# Patient Record
Sex: Male | Born: 1960 | Race: Black or African American | Hispanic: No | Marital: Single | Smoking: Current every day smoker
Health system: Southern US, Community
[De-identification: ages and names within clinical notes are randomized; demographics above are authoritative.]

## PROBLEM LIST (undated history)

## (undated) DIAGNOSIS — E119 Type 2 diabetes mellitus without complications: Secondary | ICD-10-CM

## (undated) DIAGNOSIS — E785 Hyperlipidemia, unspecified: Secondary | ICD-10-CM

## (undated) DIAGNOSIS — I10 Essential (primary) hypertension: Secondary | ICD-10-CM

## (undated) DIAGNOSIS — F1910 Other psychoactive substance abuse, uncomplicated: Secondary | ICD-10-CM

## (undated) DIAGNOSIS — M109 Gout, unspecified: Secondary | ICD-10-CM

## (undated) DIAGNOSIS — K219 Gastro-esophageal reflux disease without esophagitis: Secondary | ICD-10-CM

## (undated) HISTORY — PX: CORONARY ARTERY BYPASS GRAFT: SHX141

---

## 2015-03-18 HISTORY — PX: CORONARY ANGIOPLASTY WITH STENT PLACEMENT: SHX49

## 2016-02-08 ENCOUNTER — Encounter (HOSPITAL_COMMUNITY): Payer: Self-pay | Admitting: Emergency Medicine

## 2016-02-08 ENCOUNTER — Emergency Department (HOSPITAL_COMMUNITY)
Admission: EM | Admit: 2016-02-08 | Discharge: 2016-02-08 | Disposition: A | Discharge status: Home / Self Care | Payer: Medicare Other | Attending: Emergency Medicine | Admitting: Emergency Medicine

## 2016-02-08 DIAGNOSIS — Z87891 Personal history of nicotine dependence: Secondary | ICD-10-CM | POA: Insufficient documentation

## 2016-02-08 DIAGNOSIS — M79671 Pain in right foot: Secondary | ICD-10-CM

## 2016-02-08 DIAGNOSIS — M79672 Pain in left foot: Secondary | ICD-10-CM | POA: Insufficient documentation

## 2016-02-08 HISTORY — DX: Gout, unspecified: M10.9

## 2016-02-08 HISTORY — DX: Other psychoactive substance abuse, uncomplicated: F19.10

## 2016-02-08 LAB — URINALYSIS, ROUTINE W REFLEX MICROSCOPIC
BILIRUBIN URINE: NEGATIVE
GLUCOSE, UA: NEGATIVE mg/dL
Hgb urine dipstick: NEGATIVE
Ketones, ur: NEGATIVE mg/dL
Leukocytes, UA: NEGATIVE
NITRITE: NEGATIVE
PH: 5 (ref 5.0–8.0)
Protein, ur: NEGATIVE mg/dL
SPECIFIC GRAVITY, URINE: 1.029 (ref 1.005–1.030)

## 2016-02-08 LAB — BASIC METABOLIC PANEL
ANION GAP: 8 (ref 5–15)
BUN: 13 mg/dL (ref 6–20)
CHLORIDE: 108 mmol/L (ref 101–111)
CO2: 25 mmol/L (ref 22–32)
Calcium: 9.3 mg/dL (ref 8.9–10.3)
Creatinine, Ser: 0.99 mg/dL (ref 0.61–1.24)
GFR calc Af Amer: >60 mL/min (ref >60)
GLUCOSE: 90 mg/dL (ref 65–99)
POTASSIUM: 4.2 mmol/L (ref 3.5–5.1)
Sodium: 141 mmol/L (ref 135–145)

## 2016-02-08 LAB — CBC
HEMATOCRIT: 41.2 % (ref 39.0–52.0)
HEMOGLOBIN: 13.8 g/dL (ref 13.0–17.0)
MCH: 29.5 pg (ref 26.0–34.0)
MCHC: 33.5 g/dL (ref 30.0–36.0)
MCV: 88 fL (ref 78.0–100.0)
Platelets: 267 10*3/uL (ref 150–400)
RBC: 4.68 MIL/uL (ref 4.22–5.81)
RDW: 13.7 % (ref 11.5–15.5)
WBC: 6.9 10*3/uL (ref 4.0–10.5)

## 2016-02-08 MED ORDER — KETOROLAC TROMETHAMINE 15 MG/ML IJ SOLN: 15.0000 mg | Freq: Once | INTRAMUSCULAR | Status: DC

## 2016-02-08 MED ORDER — KETOROLAC TROMETHAMINE 15 MG/ML IJ SOLN
15.0000 mg | Freq: Once | INTRAMUSCULAR | Status: AC
  Administered 2016-02-08: 15 mg via INTRAMUSCULAR
  Filled 2016-02-08: qty 1

## 2016-02-08 NOTE — ED Provider Notes (Signed)
[facility] DEPT Provider Note   CSN: [id number] Arrival date & time: 02/08/16  1324  History   Chief Complaint Chief Complaint  Patient presents with  . Near Syncope  . Congestive Heart Failure  . Altered Mental Status   HPI Victor Ross is a 55 y.o. male.  HPI Presenting with pain which occurred in bilateral feet after standing all morning. Had been standing all morning for [employer] when his occurred. 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 Would like a note stating he cannot do this due to his cardiac history and foot pain. Reports history of gout.  Also of note, reports significant cardiac history, stating he had three stents placed in his heart in January 2017. He was supposed to follow up with Cardiology in August 2017, but he missed this appointment and then moved from [place] to [place]. Reports he also has history of his heart racing and was given medicine to take as needed to help it slow down, but does not remember the name of his diagnosis. Does not smoke. Reports history of hyperlipidemia, hypertension, and diabetes.   Past Medical History:  Diagnosis Date  . Drug abuse   . Gout    There are no active problems to display for this patient.  History reviewed. No pertinent surgical history.   Home Medications    Prior to Admission medications   Not on File    Family History No family history on file.  Social History Social History  Substance Use Topics  . Smoking status: Former Research scientist (life sciences)  . Smokeless tobacco: Former Systems developer  . Alcohol use Yes     Allergies   Patient has no known allergies.   Review of Systems Review of Systems  Constitutional: Negative for diaphoresis and fever.  Respiratory: Negative for shortness of breath.   Cardiovascular: Negative for chest pain, palpitations and leg swelling.  Neurological: Positive for dizziness.     Physical Exam Updated Vital  Signs BP 142/75   Pulse (!) 57   Temp 97.9 F (36.6 C) (Oral)   Resp 15   Ht 5\' 9"  (1.753 m)   Wt 115.2 kg   SpO2 100%   BMI 37.51 kg/m   Physical Exam  Constitutional: He appears well-developed and well-nourished. No distress.  Cardiovascular: Normal rate and regular rhythm.   No murmur heard. Pulmonary/Chest: Effort normal. No respiratory distress. He has no wheezes.  Abdominal: Soft. He exhibits no distension. There is no tenderness.  Musculoskeletal: He exhibits no edema.  No tenderness over feet bilaterally  Skin: Skin is warm. No erythema.  Psychiatric: He has a normal mood and affect. His behavior is normal.     ED Treatments / Results  Labs (all labs ordered are listed, but only abnormal results are displayed) Labs Reviewed  BASIC METABOLIC PANEL  CBC  URINALYSIS, ROUTINE W REFLEX MICROSCOPIC (NOT AT [facility])    EKG  EKG Interpretation  Date/Time:  Friday February 08 2016 13:43:48 EST Ventricular Rate:  63 PR Interval:  148 QRS Duration: 146 QT Interval:  432 QTC Calculation: 442 R Axis:   -151 Text Interpretation:  Normal sinus rhythm Right bundle branch block Abnormal ECG No previous tracing Confirmed by KNOTT MD, DANIEL [id number]) on 02/08/2016 4:49:47 PM      Radiology No results found.  Procedures Procedures (including critical care time)  Medications Ordered in ED Medications  ketorolac (TORADOL) 15 MG/ML injection 15 mg (15  mg Intramuscular Given 02/08/16 1710)    Initial Impression / Assessment and Plan / ED Course  I have reviewed the triage vital signs and the nursing notes.  Pertinent labs & imaging results that were available during my care of the patient were reviewed by me and considered in my medical decision making (see chart for details).  Clinical Course   - BMP normal - CBC normal - EKG with right bundle branch block, normal rate and sinus rhythm  Final Clinical Impressions(s) / ED Diagnoses   Final diagnoses:  Pain in both  feet  - Nontender with normal exam. Doubt gout as contributing. Note given to provide breaks from standing every 4hours. Over the counter treatment as needed. Otherwise, follow up with PCP. Recommend Cardiology referral as outpatient.  New Prescriptions New Prescriptions   No medications on file     Lorna Few, DO 02/08/16 1732    Leo Grosser, MD 02/09/16 [phone]

## 2016-02-08 NOTE — ED Triage Notes (Signed)
Pt. Stated, I was working at the [employer] , Im in the program and all Of a sudden I couldn't remember what I was suppose to do. Pt. At triage, alert and orient x 4. Pt. Stated, I think its bothering me to stand on my feet for 10 hours.

## 2016-02-08 NOTE — Discharge Instructions (Signed)
Please establish with primary physician for follow up. You will need a referral to Cardiology. You may take over the counter pain medication for your pain.

## 2016-02-18 ENCOUNTER — Ambulatory Visit (INDEPENDENT_AMBULATORY_CARE_PROVIDER_SITE_OTHER): Payer: Medicare Other | Admitting: Family Medicine

## 2016-02-18 ENCOUNTER — Encounter: Payer: Self-pay | Admitting: Family Medicine

## 2016-02-18 ENCOUNTER — Telehealth: Payer: Self-pay

## 2016-02-18 VITALS — BP 116/73 | HR 53 | Temp 97.5°F | Resp 14 | Ht 69.0 in | Wt 260.0 lb

## 2016-02-18 DIAGNOSIS — E119 Type 2 diabetes mellitus without complications: Secondary | ICD-10-CM | POA: Insufficient documentation

## 2016-02-18 DIAGNOSIS — E118 Type 2 diabetes mellitus with unspecified complications: Secondary | ICD-10-CM

## 2016-02-18 DIAGNOSIS — Z1159 Encounter for screening for other viral diseases: Secondary | ICD-10-CM | POA: Diagnosis not present

## 2016-02-18 DIAGNOSIS — Z7689 Persons encountering health services in other specified circumstances: Secondary | ICD-10-CM

## 2016-02-18 DIAGNOSIS — I25810 Atherosclerosis of coronary artery bypass graft(s) without angina pectoris: Secondary | ICD-10-CM | POA: Insufficient documentation

## 2016-02-18 DIAGNOSIS — I209 Angina pectoris, unspecified: Secondary | ICD-10-CM | POA: Diagnosis not present

## 2016-02-18 DIAGNOSIS — Z114 Encounter for screening for human immunodeficiency virus [HIV]: Secondary | ICD-10-CM | POA: Diagnosis not present

## 2016-02-18 DIAGNOSIS — I25118 Atherosclerotic heart disease of native coronary artery with other forms of angina pectoris: Secondary | ICD-10-CM

## 2016-02-18 DIAGNOSIS — Z125 Encounter for screening for malignant neoplasm of prostate: Secondary | ICD-10-CM

## 2016-02-18 DIAGNOSIS — I25708 Atherosclerosis of coronary artery bypass graft(s), unspecified, with other forms of angina pectoris: Secondary | ICD-10-CM | POA: Diagnosis not present

## 2016-02-18 LAB — LIPID PANEL
CHOL/HDL RATIO: 3.5 ratio (ref <5.0)
Cholesterol: 99 mg/dL (ref <200)
HDL: 28 mg/dL — ABNORMAL LOW (ref >40)
LDL CALC: 51 mg/dL (ref <100)
Triglycerides: 98 mg/dL (ref <150)
VLDL: 20 mg/dL (ref <30)

## 2016-02-18 LAB — POCT GLYCOSYLATED HEMOGLOBIN (HGB A1C): HEMOGLOBIN A1C: 6.2

## 2016-02-18 MED ORDER — NITROGLYCERIN 0.4 MG SL SUBL: 0.4000 mg | SUBLINGUAL_TABLET | SUBLINGUAL | 2 refills | Status: AC | PRN

## 2016-02-18 MED ORDER — METFORMIN HCL 500 MG PO TABS: 500.0000 mg | ORAL_TABLET | Freq: Two times a day (BID) | ORAL | 1 refills | Status: AC

## 2016-02-18 NOTE — Progress Notes (Signed)
Victor Ross, is a 55 y.o. male  [id number]  [id number]  DOB - [birth year]  CC:  Chief Complaint  Patient presents with  . new patient    here to get established in a program until June 2018 when he will return to [place], he does not have pharmacy here, will call with name of one, he needs refill on Metformin and Nitrogylcerin and needs referral to cardiologist, declines flu vaccine       HPI: Victor Ross is a 55 y.o. male here to establish care. He is from [place] but is in [place] until June 2018 in a drug rehab program. He has a history of diabetes and coronary artery disease with a triple BPG in 2012 and stent placement in 2017. He needs referral to cardiologist to follow him while he is here. He needs refills on his metfor 500 and on NTG. His other medications are listed in his medication list and have been reviewed. He voices no complaints today. He reports he uses NTG about once a month. He has in the last few weeks stopped smoking, drinking alcohol and using cocaine. He also has a history of gout.   Health Maintenance: Declines immunizations today. Reports having colonoscopy 2 years ago.    No Known Allergies Past Medical History:  Diagnosis Date  . Drug abuse   . Gout    No current outpatient prescriptions on file prior to visit.   No current facility-administered medications on file prior to visit.    Family History  Problem Relation Age of Onset  . Cancer Father    Social History   Social History  . Marital status: Single    Spouse name: Victor Ross  . Number of children: Victor Ross  . Years of education: Victor Ross   Occupational History  . Not on file.   Social History Main Topics  . Smoking status: Former Research scientist (life sciences)  . Smokeless tobacco: Former Systems developer  . Alcohol use Yes     Comment: currently not using  . Drug use:     Types: Cocaine     Comment: currently not using  . Sexual activity: Not on file   Other Topics Concern  . Not on file   Social History  Narrative  . No narrative on file    Review of Systems: Constitutional: + for fatigue Skin: Negative HENT: Negative  Eyes: Negative. Need glasses Neck: Negative Respiratory: Negative Cardiovascular: + for occ chest pressure, relieved with NTG. + occ palpitations Gastrointestinal: Negative Genitourinary: Negative  Musculoskeletal: + for back, knee and ankle pain (chronic)  Neurological: Negative for Hematological: Negative  Psychiatric/Behavioral: Negative    Objective:   Vitals:   02/18/16 0832  BP: 116/73  Pulse: (!) 53  Resp: 14  Temp: 97.5 F (36.4 C)    Physical Exam: Constitutional: Patient appears well-developed and well-nourished. No distress. HENT: Normocephalic, atraumatic, External right and left ear normal. Oropharynx is clear and moist.  Eyes: Conjunctivae and EOM are normal. PERRLA, no scleral icterus. Neck: Normal ROM. Neck supple. No lymphadenopathy, No thyromegaly. CVS: RRR, S1/S2 +, no murmurs, no gallops, no rubs Pulmonary: Effort and breath sounds normal, no stridor, rhonchi, wheezes, rales.  Abdominal: Soft. Normoactive BS,, no distension, tenderness, rebound or guarding.  Musculoskeletal: Normal range of motion. No edema and no tenderness.  Neuro: Alert.Normal muscle tone coordination. Non-focal Skin: Skin is warm and dry. No rash noted. Not diaphoretic. No erythema. No pallor. Psychiatric: Normal mood and affect. Behavior, judgment, thought content normal.  Lab Results  Component Value Date   WBC 6.9 02/08/2016   HGB 13.8 02/08/2016   HCT 41.2 02/08/2016   MCV 88.0 02/08/2016   PLT 267 02/08/2016   Lab Results  Component Value Date   CREATININE 0.99 02/08/2016   BUN 13 02/08/2016   NA 141 02/08/2016   K 4.2 02/08/2016   CL 108 02/08/2016   CO2 25 02/08/2016    Lab Results  Component Value Date   HGBA1C 6.2 02/18/2016   Lipid Panel  No results found for: CHOL, TRIG, HDL, CHOLHDL, VLDL, LDLCALC      Assessment and plan:   1.  Type 2 diabetes mellitus with complication, without long-term current use of insulin (HCC)  - POCT glycosylated hemoglobin (Hb A1C) - Lipid panel  2. Encounter for special screening examination for HIV  - HIV antibody (with reflex)  3. Encounter for hepatitis C screening test for low risk patient  - Hepatitis C Antibody  5. Coronary artery disease of native heart with stable angina pectoris, unspecified vessel or lesion type [facility])  - Ambulatory referral to Cardiology   Return in about 3 months (around 05/18/2016).  The patient was given clear instructions to go to ER or return to medical center if symptoms don't improve, worsen or new problems develop. The patient verbalized understanding.    Micheline Chapman FNP  02/18/2016, 10:39 AM

## 2016-02-18 NOTE — Patient Instructions (Signed)
Continue current treatment Follow-up in 3 months. Will put in referral for cardiologist.

## 2016-02-19 LAB — HEPATITIS C ANTIBODY: HCV Ab: NEGATIVE

## 2016-02-19 LAB — HIV ANTIBODY (ROUTINE TESTING W REFLEX): HIV 1&2 Ab, 4th Generation: NONREACTIVE

## 2016-04-15 ENCOUNTER — Ambulatory Visit: Payer: Medicare Other | Admitting: Interventional Cardiology

## 2016-04-17 ENCOUNTER — Encounter: Payer: Self-pay | Admitting: Interventional Cardiology

## 2016-05-20 ENCOUNTER — Ambulatory Visit: Payer: Medicare Other | Admitting: Family Medicine

## 2017-04-19 ENCOUNTER — Other Ambulatory Visit: Payer: Self-pay

## 2017-04-19 ENCOUNTER — Observation Stay
Admission: EM | Admit: 2017-04-19 | Discharge: 2017-04-19 | Discharge status: Against Medical Advice | Payer: Medicare Other | Attending: Internal Medicine | Admitting: Internal Medicine

## 2017-04-19 DIAGNOSIS — Z7982 Long term (current) use of aspirin: Secondary | ICD-10-CM | POA: Diagnosis not present

## 2017-04-19 DIAGNOSIS — Z5321 Procedure and treatment not carried out due to patient leaving prior to being seen by health care provider: Secondary | ICD-10-CM | POA: Insufficient documentation

## 2017-04-19 DIAGNOSIS — I25810 Atherosclerosis of coronary artery bypass graft(s) without angina pectoris: Secondary | ICD-10-CM | POA: Insufficient documentation

## 2017-04-19 DIAGNOSIS — E119 Type 2 diabetes mellitus without complications: Secondary | ICD-10-CM | POA: Diagnosis not present

## 2017-04-19 DIAGNOSIS — F17200 Nicotine dependence, unspecified, uncomplicated: Secondary | ICD-10-CM | POA: Diagnosis not present

## 2017-04-19 DIAGNOSIS — M109 Gout, unspecified: Secondary | ICD-10-CM | POA: Insufficient documentation

## 2017-04-19 DIAGNOSIS — Z955 Presence of coronary angioplasty implant and graft: Secondary | ICD-10-CM | POA: Diagnosis not present

## 2017-04-19 DIAGNOSIS — Z951 Presence of aortocoronary bypass graft: Secondary | ICD-10-CM | POA: Diagnosis not present

## 2017-04-19 DIAGNOSIS — Z79899 Other long term (current) drug therapy: Secondary | ICD-10-CM | POA: Insufficient documentation

## 2017-04-19 DIAGNOSIS — K922 Gastrointestinal hemorrhage, unspecified: Secondary | ICD-10-CM | POA: Diagnosis present

## 2017-04-19 DIAGNOSIS — Z7902 Long term (current) use of antithrombotics/antiplatelets: Secondary | ICD-10-CM | POA: Insufficient documentation

## 2017-04-19 DIAGNOSIS — K625 Hemorrhage of anus and rectum: Secondary | ICD-10-CM | POA: Diagnosis present

## 2017-04-19 LAB — COMPREHENSIVE METABOLIC PANEL
ALK PHOS: 108 U/L (ref 38–126)
ALT: 40 U/L (ref 17–63)
AST: 31 U/L (ref 15–41)
Albumin: 4.4 g/dL (ref 3.5–5.0)
Anion gap: 10 (ref 5–15)
BILIRUBIN TOTAL: 1.1 mg/dL (ref 0.3–1.2)
BUN: 14 mg/dL (ref 6–20)
CALCIUM: 9.2 mg/dL (ref 8.9–10.3)
CO2: 21 mmol/L — ABNORMAL LOW (ref 22–32)
CREATININE: 0.94 mg/dL (ref 0.61–1.24)
Chloride: 106 mmol/L (ref 101–111)
Glucose, Bld: 159 mg/dL — ABNORMAL HIGH (ref 65–99)
Potassium: 3.9 mmol/L (ref 3.5–5.1)
Sodium: 137 mmol/L (ref 135–145)
Total Protein: 7.2 g/dL (ref 6.5–8.1)

## 2017-04-19 LAB — TYPE AND SCREEN
ABO/RH(D): A POS
ANTIBODY SCREEN: NEGATIVE

## 2017-04-19 LAB — CBC
HCT: 44.7 % (ref 40.0–52.0)
Hemoglobin: 15.1 g/dL (ref 13.0–18.0)
MCH: 29.7 pg (ref 26.0–34.0)
MCHC: 33.9 g/dL (ref 32.0–36.0)
MCV: 87.6 fL (ref 80.0–100.0)
Platelets: 273 10*3/uL (ref 150–440)
RBC: 5.1 MIL/uL (ref 4.40–5.90)
RDW: 14 % (ref 11.5–14.5)
WBC: 9.3 10*3/uL (ref 3.8–10.6)

## 2017-04-19 NOTE — ED Triage Notes (Signed)
Pt arrives to ED c/o of "bloody stool that is dark red" x 2 days. States has happened before. Denies hemorrhoids. Takes plavix. Alert, oriented, ambulatory. No distress noted.

## 2017-04-19 NOTE — ED Notes (Signed)
Admitting medical provider spoke to Charge nurse and informed charge that patient wants to leave.

## 2017-04-19 NOTE — ED Notes (Signed)
Patient refused vital signs 

## 2017-04-19 NOTE — Consult Note (Signed)
Medical Consultation  Victor Ross [id number] DOB: 1960/11/21 DOA: 04/19/2017 PCP: Micheline Chapman, NP   Requesting physician: dr Dineen Kid Date of consultation: 04/19/2017 Reason for consultation: rectal bleed  Impression/Recommendations  57 year old male with history of CAD, tobacco dependence and drug abuse who presents with rectal bleed  1. Rectal bleed: Patient was encouraged to stay in the hospital however he was very adamant about being discharged. I did discuss that there is possibility he could have further rectal bleed and become more symptomatic. I also discussed with the patient since he is on blood thinner for his CAD/stent it is very important that he have GI follow-up due to his rectal bleeding. He voiced that he understood this however he wanted to go home  2. Tobacco dependence: Patient is encouraged to quit smoking. Counseling was provided for 4 minutes.    Chief Complaint: rectal bleed  HPI:   57 year old male with history of CAD and tobacco dependence who presented to the ER complaining of rectal bleed. Patient proceeded to large bloody bowel movements this morning. He denies abdominal pain, nausea and vomiting. He denies dark color stools. He is on blood thinners,  Review of Systems  Constitutional: Negative for fever, chills weight loss HENT: Negative for ear pain, nosebleeds, congestion, facial swelling, rhinorrhea, neck pain, neck stiffness and ear discharge.   Respiratory: Negative for cough, shortness of breath, wheezing  Cardiovascular: Negative for chest pain, palpitations and leg swelling.  Gastrointestinal: Negative for heartburn, abdominal pain, vomiting, diarrhea or consitpation Positive for rectal bleed Genitourinary: Negative for dysuria, urgency, frequency, hematuria Musculoskeletal: Negative for back pain or joint pain Neurological: Negative for dizziness, seizures, syncope, focal weakness,  numbness and headaches.  Hematological: Does not  bruise/bleed easily.  Psychiatric/Behavioral: Negative for hallucinations, confusion, dysphoric mood   Past Medical History:  Diagnosis Date  . Drug abuse ([place])   . Gout    Past Surgical History:  Procedure Laterality Date  . CORONARY ANGIOPLASTY WITH STENT PLACEMENT Left 03/2015  . CORONARY ARTERY BYPASS GRAFT     Social History:  reports that he has been smoking.  He has quit using smokeless tobacco. He reports that he drinks alcohol. He reports that he uses drugs. Drug: Cocaine.  No Known Allergies Family History  Problem Relation Age of Onset  . Cancer Father     Prior to Admission medications   Medication Sig Start Date End Date Taking? Authorizing Provider  aspirin EC 81 MG tablet Take 81 mg by mouth daily.   Yes [provider]  carvedilol (COREG) 12.5 MG tablet Take 12.5 mg by mouth 2 (two) times daily with a meal.   Yes [provider]  COLCRYS 0.6 MG tablet Take 1 tablet by mouth as needed. For acute gout - repeat dose after 1 to 2 hours if needed 02/13/17  Yes [provider]  lisinopril (PRINIVIL,ZESTRIL) 10 MG tablet Take 10 mg by mouth daily. 04/10/17  Yes [provider]  metFORMIN (GLUCOPHAGE) 1000 MG tablet Take 1,000 mg by mouth 2 (two) times daily. 04/10/17  Yes [provider]  nitroGLYCERIN (NITROSTAT) 0.4 MG SL tablet Place 1 tablet (0.4 mg total) under the tongue every 5 (five) minutes as needed for chest pain. 02/18/16  Yes Micheline Chapman, NP  pantoprazole (PROTONIX) 40 MG tablet Take 40 mg by mouth daily. 03/28/17  Yes [provider]  atorvastatin (LIPITOR) 80 MG tablet Take 80 mg by mouth daily.    [provider]  metFORMIN (GLUCOPHAGE) 500 MG  tablet Take 1 tablet (500 mg total) by mouth 2 (two) times daily with a meal. Patient not taking: Reported on 04/19/2017 02/18/16   Micheline Chapman, NP  ticagrelor (BRILINTA) 90 MG TABS tablet Take by mouth 2 (two) times daily.    [provider]    Physical Exam: Blood pressure 127/76, pulse 89, temperature 98.3 F (36.8 C), temperature source Oral, resp. rate 16, height 5\' 9"  (1.753 m), weight 122.5 kg (270 lb), SpO2 95 %. @VITALS2 @ Filed Weights   04/19/17 1510  Weight: 122.5 kg (270 lb)   No intake or output data in the 24 hours ending 04/19/17 1634   Constitutional: Appears well-developed and well-nourished. No distress. HENT: Normocephalic. Marland Kitchen Oropharynx is clear and moist.  Eyes: Conjunctivae and EOM are normal. PERRLA, no scleral icterus.  Neck: Normal ROM. Neck supple. No JVD. No tracheal deviation. CVS: RRR, S1/S2 +, no murmurs, no gallops, no carotid bruit.  Pulmonary: Effort and breath sounds normal, no stridor, rhonchi, wheezes, rales.  Abdominal: Soft. BS +,  no distension, tenderness, rebound or guarding.  Musculoskeletal: Normal range of motion. No edema and no tenderness.  Neuro: Alert. CN 2-12 grossly intact. No focal deficits. Skin: Skin is warm and dry. No rash noted. Psychiatric: Normal mood and affect.    Labs  Basic Metabolic Panel: Recent Labs  Lab 04/19/17 1512  NA 137  K 3.9  CL 106  CO2 21*  GLUCOSE 159*  BUN 14  CREATININE 0.94  CALCIUM 9.2   Liver Function Tests: Recent Labs  Lab 04/19/17 1512  AST 31  ALT 40  ALKPHOS 108  BILITOT 1.1  PROT 7.2  ALBUMIN 4.4   No results for input(s): LIPASE, AMYLASE in the last 168 hours.  CBC: Recent Labs  Lab 04/19/17 1512  WBC 9.3  HGB 15.1  HCT 44.7  MCV 87.6  PLT 273   Cardiac Enzymes: No results for input(s): CKTOTAL, CKMB, CKMBINDEX, TROPONINI in the last 168 hours. BNP: Invalid input(s): POCBNP CBG: No results for input(s): GLUCAP in the last 168 hours.  Radiological Exams: No results found.  EKG: pending   Thank you for allowing me to participate in the care of your patient. Patient wanted to leave AMA. Case discussed with ED physician   Note: This dictation was prepared with Dragon dictation along with  smaller phrase technology. Any transcriptional errors that result from this process are unintentional.  Time spent: 45 minutes  Tiea Manninen, MD

## 2017-04-19 NOTE — ED Provider Notes (Signed)
[facility] Emergency Department Provider Note  ____________________________________________   First MD Initiated Contact with Patient 04/19/17 1605     (approximate)  I have reviewed the triage vital signs and the nursing notes.   HISTORY  Chief Complaint Rectal Bleeding   HPI Victor Ross is a 57 y.o. male history of CAD on Brilinta who is presenting to the emergency department today with rectal bleeding.  He says that he has had dark, bloody stools over the past 2 days.  He said that he had 2 stools yesterday with dark bloody as well as one this morning.  However, he says the stool this morning seemed like stool with blood over top of it and that the blood was "lightening up."  He denies any dizziness or lightheadedness.  Denies any abdominal pain.  Says that he had an episode several years ago similar to this but did not last as long and resolved on its own he was not evaluated in the hospital.  Past Medical History:  Diagnosis Date  . Drug abuse ([place])   . Gout     Patient Active Problem List   Diagnosis Date Noted  . GIB (gastrointestinal bleeding) 04/19/2017  . Encounter to establish care 02/18/2016  . CAD (coronary artery disease) of artery bypass graft 02/18/2016  . Diabetes ([place]) 02/18/2016    Past Surgical History:  Procedure Laterality Date  . CORONARY ANGIOPLASTY WITH STENT PLACEMENT Left 03/2015  . CORONARY ARTERY BYPASS GRAFT      Prior to Admission medications   Medication Sig Start Date End Date Taking? Authorizing Provider  aspirin EC 81 MG tablet Take 81 mg by mouth daily.   Yes [provider]  carvedilol (COREG) 12.5 MG tablet Take 12.5 mg by mouth 2 (two) times daily with a meal.   Yes [provider]  COLCRYS 0.6 MG tablet Take 1 tablet by mouth as needed. For acute gout - repeat dose after 1 to 2 hours if needed 02/13/17  Yes [provider]  lisinopril (PRINIVIL,ZESTRIL) 10 MG tablet Take 10 mg  by mouth daily. 04/10/17  Yes [provider]  metFORMIN (GLUCOPHAGE) 1000 MG tablet Take 1,000 mg by mouth 2 (two) times daily. 04/10/17  Yes [provider]  nitroGLYCERIN (NITROSTAT) 0.4 MG SL tablet Place 1 tablet (0.4 mg total) under the tongue every 5 (five) minutes as needed for chest pain. 02/18/16  Yes Micheline Chapman, NP  pantoprazole (PROTONIX) 40 MG tablet Take 40 mg by mouth daily. 03/28/17  Yes [provider]  atorvastatin (LIPITOR) 80 MG tablet Take 80 mg by mouth daily.    [provider]  metFORMIN (GLUCOPHAGE) 500 MG tablet Take 1 tablet (500 mg total) by mouth 2 (two) times daily with a meal. Patient not taking: Reported on 04/19/2017 02/18/16   Micheline Chapman, NP  ticagrelor (BRILINTA) 90 MG TABS tablet Take by mouth 2 (two) times daily.    [provider]    Allergies Patient has no known allergies.  Family History  Problem Relation Age of Onset  . Cancer Father     Social History Social History   Tobacco Use  . Smoking status: Current Every Day Smoker  . Smokeless tobacco: Former Network engineer Use Topics  . Alcohol use: Yes    Comment: currently not using  . Drug use: Yes    Types: Cocaine    Comment: currently not using    Review of Systems  Constitutional: No  fever/chills Eyes: No visual changes. ENT: No sore throat. Cardiovascular: Denies chest pain. Respiratory: Denies shortness of breath. Gastrointestinal: No abdominal pain.  No nausea, no vomiting.  No diarrhea.  No constipation. Genitourinary: Negative for dysuria. Musculoskeletal: Negative for back pain. Skin: Negative for rash. Neurological: Negative for headaches, focal weakness or numbness.   ____________________________________________   PHYSICAL EXAM:  VITAL SIGNS: ED Triage Vitals [04/19/17 1510]  Enc Vitals Group     BP 127/76     Pulse Rate 89     Resp 16     Temp 98.3 F (36.8 C)     Temp Source Oral     SpO2 95 %      Weight 270 lb (122.5 kg)     Height 5\' 9"  (1.753 m)     Head Circumference      Peak Flow      Pain Score 0     Pain Loc      Pain Edu?      Excl. in [place]?     Constitutional: Alert and oriented. Well appearing and in no acute distress. Eyes: Conjunctivae are normal.  Head: Atraumatic. Nose: No congestion/rhinnorhea. Mouth/Throat: Mucous membranes are moist.  Neck: No stridor.   Cardiovascular: Normal rate, regular rhythm. Grossly normal heart sounds.   Respiratory: Normal respiratory effort.  No retractions. Lungs CTAB. Gastrointestinal: Soft and nontender. No distention.  No external hemorrhoids on rectal exam.  Digital rectal exam with dark brown stool on glove which is strongly heme positive on Hemoccult. Musculoskeletal: No lower extremity tenderness nor edema.  No joint effusions. Neurologic:  Normal speech and language. No gross focal neurologic deficits are appreciated. Skin:  Skin is warm, dry and intact. No rash noted. Psychiatric: Mood and affect are normal. Speech and behavior are normal.  ____________________________________________   LABS (all labs ordered are listed, but only abnormal results are displayed)  Labs Reviewed  COMPREHENSIVE METABOLIC PANEL - Abnormal; Notable for the following components:      Result Value   CO2 21 (*)    Glucose, Bld 159 (*)    All other components within normal limits  CBC  POC OCCULT BLOOD, ED  TYPE AND SCREEN   ____________________________________________  EKG   ____________________________________________  RADIOLOGY   ____________________________________________   PROCEDURES  Procedure(s) performed:   Procedures  Critical Care performed:   ____________________________________________   INITIAL IMPRESSION / ASSESSMENT AND PLAN / ED COURSE  Pertinent labs & imaging results that were available during my care of the patient were reviewed by me and considered in my medical decision making (see chart for  details).  DDX: Internal hemorrhoids, diverticulosis, GI bleeding, coagulopathy secondary to Brilinta, bleeding ulcers As part of my medical decision making, I reviewed the following data within the electronic MEDICAL RECORD NUMBER Notes from prior ED visits     ----------------------------------------- 4:30 PM on 04/19/2017 -----------------------------------------  Patient with very reassuring blood work.  However concerning patient's multiple bloody bowel movements as well as a positive Hemoccult test and is on Brilinta.  I recommended to the patient that he stay in the hospital.  However, the patient was refusing.  We discussed return precautions and that leaving could result in death progression of his illness, permanent disability.  The patient had capacity to understand and make his medical decisions.  He was not clinically intoxicated.  The admitting hospitalist, Dr. Genia Harold, also spoke with the patient and I placed orders.  However, patient proceeded to be low.  ____________________________________________  FINAL CLINICAL IMPRESSION(S) / ED DIAGNOSES  GI bleeding.    NEW MEDICATIONS STARTED DURING THIS VISIT:  New Prescriptions   No medications on file     Note:  This document was prepared using Dragon voice recognition software and may include unintentional dictation errors.     Orbie Pyo, MD 04/19/17 [phone]

## 2017-04-19 NOTE — ED Notes (Signed)
This Probation officer went and spoke to patient and explained that rectal bleeding is serious, attempted to convince patient to stay at least one night for GI specialist to consult. Patient refused and had the backing of his spouse on leaving.  Patient stated "Last time it just went away and got better. This time it's not as bad as it was yesterday"  Multiple attempts were made to educate patient on the benefits of staying to be seen by specialists and monitored, patient did not want to heed to the education and advice.

## 2017-09-02 ENCOUNTER — Encounter: Payer: Self-pay | Admitting: *Deleted

## 2017-09-03 ENCOUNTER — Encounter: Payer: Self-pay | Admitting: Anesthesiology

## 2017-09-03 ENCOUNTER — Ambulatory Visit: Payer: Medicare Other | Admitting: Anesthesiology

## 2017-09-03 ENCOUNTER — Ambulatory Visit
Admission: RE | Admit: 2017-09-03 | Discharge: 2017-09-03 | Disposition: A | Discharge status: Home / Self Care | Payer: Medicare Other | Source: Ambulatory Visit | Attending: Gastroenterology | Admitting: Gastroenterology

## 2017-09-03 ENCOUNTER — Encounter
Admission: RE | Disposition: A | Discharge status: Home / Self Care | Payer: Self-pay | Source: Ambulatory Visit | Attending: Gastroenterology

## 2017-09-03 DIAGNOSIS — E119 Type 2 diabetes mellitus without complications: Secondary | ICD-10-CM | POA: Diagnosis not present

## 2017-09-03 DIAGNOSIS — I10 Essential (primary) hypertension: Secondary | ICD-10-CM | POA: Insufficient documentation

## 2017-09-03 DIAGNOSIS — F17200 Nicotine dependence, unspecified, uncomplicated: Secondary | ICD-10-CM | POA: Diagnosis not present

## 2017-09-03 DIAGNOSIS — Z1211 Encounter for screening for malignant neoplasm of colon: Secondary | ICD-10-CM | POA: Diagnosis present

## 2017-09-03 DIAGNOSIS — K219 Gastro-esophageal reflux disease without esophagitis: Secondary | ICD-10-CM | POA: Insufficient documentation

## 2017-09-03 DIAGNOSIS — J449 Chronic obstructive pulmonary disease, unspecified: Secondary | ICD-10-CM | POA: Diagnosis not present

## 2017-09-03 DIAGNOSIS — Z5309 Procedure and treatment not carried out because of other contraindication: Secondary | ICD-10-CM | POA: Diagnosis not present

## 2017-09-03 HISTORY — DX: Type 2 diabetes mellitus without complications: E11.9

## 2017-09-03 HISTORY — DX: Hyperlipidemia, unspecified: E78.5

## 2017-09-03 HISTORY — DX: Gastro-esophageal reflux disease without esophagitis: K21.9

## 2017-09-03 HISTORY — DX: Essential (primary) hypertension: I10

## 2017-09-03 LAB — GLUCOSE, CAPILLARY: GLUCOSE-CAPILLARY: 140 mg/dL — AB (ref 65–99)

## 2017-09-03 SURGERY — COLONOSCOPY WITH PROPOFOL
Anesthesia: General

## 2017-09-03 MED ORDER — PROPOFOL 10 MG/ML IV BOLUS
INTRAVENOUS | Status: AC
  Filled 2017-09-03: qty 40

## 2017-09-03 MED ORDER — SODIUM CHLORIDE 0.9 % IV SOLN
INTRAVENOUS | Status: DC
  Administered 2017-09-03: 1000 mL via INTRAVENOUS

## 2017-09-03 MED ORDER — SODIUM CHLORIDE 0.9 % IV SOLN: INTRAVENOUS | Status: DC

## 2017-09-03 NOTE — H&P (Signed)
Patient presented today for a colonoscopy.  On review of his medical chart I noticed that he does take Brilinta this was not on his outpatient chart.  He also takes 81 mg aspirin.  He did only stop the aspirin for 2 days including today.  He also would only stop the Brilinta that long.  That is not adequate for this procedure.  He will need to hold his Brilinta for 5 to 7 days.  I discussed this with him.  Of note he is only recently moved to this area and has not yet established with a new cardiologist.  We will rearrange his procedure and have cardiology review his chart prior to scheduling.

## 2017-09-03 NOTE — Anesthesia Preprocedure Evaluation (Signed)
Anesthesia Evaluation  Patient identified by MRN, date of birth, ID band Patient awake    Reviewed: Allergy & Precautions, H&P , NPO status , Patient's Chart, lab work & pertinent test results  History of Anesthesia Complications Negative for: history of anesthetic complications  Airway Mallampati: III  TM Distance: >3 FB Neck ROM: full    Dental  (+) Chipped, Poor Dentition, Missing   Pulmonary neg shortness of breath, COPD, Current Smoker,           Cardiovascular Exercise Tolerance: Good hypertension, (-) angina+ CAD and + CABG  (-) Past MI and (-) DOE      Neuro/Psych negative neurological ROS  negative psych ROS   GI/Hepatic Neg liver ROS, GERD  Controlled and Medicated,  Endo/Other  diabetes, Type 2  Renal/GU negative Renal ROS  negative genitourinary   Musculoskeletal   Abdominal   Peds  Hematology negative hematology ROS (+)   Anesthesia Other Findings Past Medical History: No date: Diabetes mellitus without complication (HCC) No date: Drug abuse ([place]) No date: GERD (gastroesophageal reflux disease) No date: Gout No date: Gout No date: Hyperlipemia No date: Hypertension  Past Surgical History: 03/2015: CORONARY ANGIOPLASTY WITH STENT PLACEMENT; Left No date: CORONARY ARTERY BYPASS GRAFT  BMI    Body Mass Index:  41.64 kg/m      Reproductive/Obstetrics negative OB ROS                             Anesthesia Physical Anesthesia Plan  ASA: III  Anesthesia Plan: General   Post-op Pain Management:    Induction: Intravenous  PONV Risk Score and Plan: Propofol infusion and TIVA  Airway Management Planned: Natural Airway and Nasal Cannula  Additional Equipment:   Intra-op Plan:   Post-operative Plan:   Informed Consent: I have reviewed the patients History and Physical, chart, labs and discussed the procedure including the risks, benefits and alternatives for  the proposed anesthesia with the patient or authorized representative who has indicated his/her understanding and acceptance.   Dental Advisory Given  Plan Discussed with: Anesthesiologist, CRNA and Surgeon  Anesthesia Plan Comments: (Patient consented for risks of anesthesia including but not limited to:  - adverse reactions to medications - risk of intubation if required - damage to teeth, lips or other oral mucosa - sore throat or hoarseness - Damage to heart, brain, lungs or loss of life  Patient voiced understanding.)        Anesthesia Quick Evaluation

## 2017-10-22 ENCOUNTER — Encounter: Payer: Self-pay | Admitting: Emergency Medicine

## 2017-10-22 ENCOUNTER — Emergency Department
Admission: EM | Admit: 2017-10-22 | Discharge: 2017-10-22 | Disposition: A | Discharge status: Home / Self Care | Payer: Medicare Other | Attending: Emergency Medicine | Admitting: Emergency Medicine

## 2017-10-22 DIAGNOSIS — I10 Essential (primary) hypertension: Secondary | ICD-10-CM | POA: Insufficient documentation

## 2017-10-22 DIAGNOSIS — E1165 Type 2 diabetes mellitus with hyperglycemia: Secondary | ICD-10-CM | POA: Diagnosis not present

## 2017-10-22 DIAGNOSIS — Z7982 Long term (current) use of aspirin: Secondary | ICD-10-CM | POA: Insufficient documentation

## 2017-10-22 DIAGNOSIS — I2510 Atherosclerotic heart disease of native coronary artery without angina pectoris: Secondary | ICD-10-CM | POA: Diagnosis not present

## 2017-10-22 DIAGNOSIS — Z7984 Long term (current) use of oral hypoglycemic drugs: Secondary | ICD-10-CM | POA: Diagnosis not present

## 2017-10-22 DIAGNOSIS — Z79899 Other long term (current) drug therapy: Secondary | ICD-10-CM | POA: Insufficient documentation

## 2017-10-22 DIAGNOSIS — R739 Hyperglycemia, unspecified: Secondary | ICD-10-CM

## 2017-10-22 DIAGNOSIS — F17210 Nicotine dependence, cigarettes, uncomplicated: Secondary | ICD-10-CM | POA: Diagnosis not present

## 2017-10-22 LAB — CBC
HCT: 43.7 % (ref 40.0–52.0)
Hemoglobin: 15 g/dL (ref 13.0–18.0)
MCH: 30 pg (ref 26.0–34.0)
MCHC: 34.3 g/dL (ref 32.0–36.0)
MCV: 87.6 fL (ref 80.0–100.0)
Platelets: 265 10*3/uL (ref 150–440)
RBC: 4.99 MIL/uL (ref 4.40–5.90)
RDW: 13.5 % (ref 11.5–14.5)
WBC: 7.2 10*3/uL (ref 3.8–10.6)

## 2017-10-22 LAB — BASIC METABOLIC PANEL
Anion gap: 7 (ref 5–15)
BUN: 12 mg/dL (ref 6–20)
CALCIUM: 9.3 mg/dL (ref 8.9–10.3)
CO2: 24 mmol/L (ref 22–32)
CREATININE: 0.98 mg/dL (ref 0.61–1.24)
Chloride: 102 mmol/L (ref 98–111)
GFR calc Af Amer: >60 mL/min (ref >60)
GLUCOSE: 422 mg/dL — AB (ref 70–99)
Potassium: 4.3 mmol/L (ref 3.5–5.1)
Sodium: 133 mmol/L — ABNORMAL LOW (ref 135–145)

## 2017-10-22 LAB — GLUCOSE, CAPILLARY
GLUCOSE-CAPILLARY: 401 mg/dL — AB (ref 70–99)
GLUCOSE-CAPILLARY: 335 mg/dL — AB (ref 70–99)

## 2017-10-22 MED ORDER — SODIUM CHLORIDE 0.9 % IV BOLUS
1000.0000 mL | Freq: Once | INTRAVENOUS | Status: AC
  Administered 2017-10-22: 1000 mL via INTRAVENOUS

## 2017-10-22 MED ORDER — GLIPIZIDE 5 MG PO TABS: 5.0000 mg | ORAL_TABLET | Freq: Every day | ORAL | 0 refills | Status: AC

## 2017-10-22 NOTE — ED Provider Notes (Signed)
[facility] Emergency Department Provider Note  ____________________________________________   First MD Initiated Contact with Patient 10/22/17 1323     (approximate)  I have reviewed the triage vital signs and the nursing notes.   HISTORY  Chief Complaint Hyperglycemia   HPI Victor Ross is a 57 y.o. male who self presents the emergency department with several days of hyperglycemia.  He has had diabetes mellitus type 2 for the past 2 or 3 years and has been taking his thousand milligrams of metformin twice a day as prescribed.  He is checked his blood sugar at home and has been in 300s which concerned him.  He does have some increased thirst and polyuria.  Symptoms have been gradual onset are now constant moderate severity.  Nothing seems to make them better or worse.  He has not seen his primary care physician in some time.  He denies fevers or chills.  He denies chest pain shortness of breath abdominal pain nausea or vomiting.    Past Medical History:  Diagnosis Date  . Diabetes mellitus without complication ([place])   . Drug abuse ([place])   . GERD (gastroesophageal reflux disease)   . Gout   . Gout   . Hyperlipemia   . Hypertension     Patient Active Problem List   Diagnosis Date Noted  . GIB (gastrointestinal bleeding) 04/19/2017  . Encounter to establish care 02/18/2016  . CAD (coronary artery disease) of artery bypass graft 02/18/2016  . Diabetes ([place]) 02/18/2016    Past Surgical History:  Procedure Laterality Date  . CORONARY ANGIOPLASTY WITH STENT PLACEMENT Left 03/2015  . CORONARY ARTERY BYPASS GRAFT      Prior to Admission medications   Medication Sig Start Date End Date Taking? Authorizing Provider  aspirin EC 81 MG tablet Take 81 mg by mouth daily.    [provider]  atorvastatin (LIPITOR) 80 MG tablet Take 80 mg by mouth daily.    [provider]  carvedilol (COREG) 12.5 MG tablet Take 12.5 mg by mouth 2 (two)  times daily with a meal.    [provider]  COLCRYS 0.6 MG tablet Take 1 tablet by mouth as needed. For acute gout - repeat dose after 1 to 2 hours if needed 02/13/17   [provider]  glipiZIDE (GLUCOTROL) 5 MG tablet Take 1 tablet (5 mg total) by mouth daily. 10/22/17 10/22/18  Darel Hong, MD  lisinopril (PRINIVIL,ZESTRIL) 10 MG tablet Take 10 mg by mouth daily. 04/10/17   [provider]  metFORMIN (GLUCOPHAGE) 1000 MG tablet Take 1,000 mg by mouth 2 (two) times daily. 04/10/17   [provider]  metFORMIN (GLUCOPHAGE) 500 MG tablet Take 1 tablet (500 mg total) by mouth 2 (two) times daily with a meal. 02/18/16   Micheline Chapman, NP  nitroGLYCERIN (NITROSTAT) 0.4 MG SL tablet Place 1 tablet (0.4 mg total) under the tongue every 5 (five) minutes as needed for chest pain. 02/18/16   Micheline Chapman, NP  pantoprazole (PROTONIX) 40 MG tablet Take 40 mg by mouth daily. 03/28/17   [provider]  ticagrelor (BRILINTA) 90 MG TABS tablet Take by mouth 2 (two) times daily.    [provider]    Allergies Patient has no known allergies.  Family History  Problem Relation Age of Onset  . Cancer Father     Social History Social History   Tobacco Use  . Smoking status: Current Every Day Smoker  . Smokeless tobacco:  Former Systems developer  Substance Use Topics  . Alcohol use: Yes    Comment: currently not using  . Drug use: Not Currently    Types: Cocaine    Comment: currently not using    Review of Systems Constitutional: No fever/chills Eyes: No visual changes. ENT: No sore throat. Cardiovascular: Denies chest pain. Respiratory: Denies shortness of breath. Gastrointestinal: No abdominal pain.  No nausea, no vomiting.  No diarrhea.  No constipation. Genitourinary: Negative for dysuria. Musculoskeletal: Negative for back pain. Skin: Negative for rash. Neurological: Negative for headaches, focal weakness or  numbness.   ____________________________________________   PHYSICAL EXAM:  VITAL SIGNS: ED Triage Vitals  Enc Vitals Group     BP 10/22/17 1155 123/73     Pulse Rate 10/22/17 1155 72     Resp 10/22/17 1155 18     Temp 10/22/17 1155 97.7 F (36.5 C)     Temp Source 10/22/17 1155 Oral     SpO2 10/22/17 1155 98 %     Weight 10/22/17 1156 284 lb (128.8 kg)     Height 10/22/17 1156 5\' 9"  (1.753 m)     Head Circumference --      Peak Flow --      Pain Score 10/22/17 1208 0     Pain Loc --      Pain Edu? --      Excl. in [place]? --     Constitutional: Alert and oriented x4 pleasant cooperative Eyes: PERRL EOMI. Head: Atraumatic. Nose: No congestion/rhinnorhea. Mouth/Throat: No trismus Neck: No stridor.   Cardiovascular: Normal rate, regular rhythm. Grossly normal heart sounds.  Good peripheral circulation. Respiratory: Normal respiratory effort.  No retractions. Lungs CTAB and moving good air Gastrointestinal: Soft nontender Musculoskeletal: No lower extremity edema   Neurologic:  Normal speech and language. No gross focal neurologic deficits are appreciated. Skin:  Skin is warm, dry and intact. No rash noted. Psychiatric: Mood and affect are normal. Speech and behavior are normal.    ____________________________________________   DIFFERENTIAL includes but not limited to  DKA, HHS, dehydration, infection ____________________________________________   LABS (all labs ordered are listed, but only abnormal results are displayed)  Labs Reviewed  GLUCOSE, CAPILLARY - Abnormal; Notable for the following components:      Result Value   Glucose-Capillary 401 (*)    All other components within normal limits  BASIC METABOLIC PANEL - Abnormal; Notable for the following components:   Sodium 133 (*)    Glucose, Bld 422 (*)    All other components within normal limits  GLUCOSE, CAPILLARY - Abnormal; Notable for the following components:   Glucose-Capillary 335 (*)    All other  components within normal limits  CBC  CBG MONITORING, ED    Lab work reviewed by me with elevated sugar but no signs of DKA __________________________________________  EKG   ____________________________________________  RADIOLOGY   ____________________________________________   PROCEDURES  Procedure(s) performed: no  Procedures  Critical Care performed: no  ____________________________________________   INITIAL IMPRESSION / ASSESSMENT AND PLAN / ED COURSE  Pertinent labs & imaging results that were available during my care of the patient were reviewed by me and considered in my medical decision making (see chart for details).   As part of my medical decision making, I reviewed the following data within the [place] History obtained from family if available, nursing notes, old chart and ekg, as well as notes from prior ED visits.  The patient has asymptomatic hyperglycemia with no  evidence of DKA or HHS.  He feels generally more energetic after a liter of fluids and his sugars come down.  I will add on glipizide low-dose and refer him back to primary care within 1 week for recheck.      ____________________________________________   FINAL CLINICAL IMPRESSION(S) / ED DIAGNOSES  Final diagnoses:  Hyperglycemia      NEW MEDICATIONS STARTED DURING THIS VISIT:  Discharge Medication List as of 10/22/2017  1:25 PM    START taking these medications   Details  glipiZIDE (GLUCOTROL) 5 MG tablet Take 1 tablet (5 mg total) by mouth daily., Starting Thu 10/22/2017, Until Fri 10/22/2018, Print         Note:  This document was prepared using Dragon voice recognition software and may include unintentional dictation errors.     Darel Hong, MD 10/24/17 [phone]

## 2017-10-22 NOTE — Discharge Instructions (Signed)
Your blood sugar was too high however it came down with some IV fluids.  Please make sure that you follow-up with your primary care physician within the next week for a recheck and return to the emergency department for any concerns.  It was a pleasure to take care of you today, and thank you for coming to our emergency department.  If you have any questions or concerns before leaving please ask the nurse to grab me and I'm more than happy to go through your aftercare instructions again.  If you were prescribed any opioid pain medication today such as Norco, Vicodin, Percocet, morphine, hydrocodone, or oxycodone please make sure you do not drive when you are taking this medication as it can alter your ability to drive safely.  If you have any concerns once you are home that you are not improving or are in fact getting worse before you can make it to your follow-up appointment, please do not hesitate to call 911 and come back for further evaluation.  Darel Hong, MD  Results for orders placed or performed during the hospital encounter of 10/22/17  Glucose, capillary  Result Value Ref Range   Glucose-Capillary 401 (H) 70 - 99 mg/dL  Basic metabolic panel  Result Value Ref Range   Sodium 133 (L) 135 - 145 mmol/L   Potassium 4.3 3.5 - 5.1 mmol/L   Chloride 102 98 - 111 mmol/L   CO2 24 22 - 32 mmol/L   Glucose, Bld 422 (H) 70 - 99 mg/dL   BUN 12 6 - 20 mg/dL   Creatinine, Ser 0.98 0.61 - 1.24 mg/dL   Calcium 9.3 8.9 - 10.3 mg/dL   GFR calc non Af Amer >60 >60 mL/min   GFR calc Af Amer >60 >60 mL/min   Anion gap 7 5 - 15  CBC  Result Value Ref Range   WBC 7.2 3.8 - 10.6 K/uL   RBC 4.99 4.40 - 5.90 MIL/uL   Hemoglobin 15.0 13.0 - 18.0 g/dL   HCT 43.7 40.0 - 52.0 %   MCV 87.6 80.0 - 100.0 fL   MCH 30.0 26.0 - 34.0 pg   MCHC 34.3 32.0 - 36.0 g/dL   RDW 13.5 11.5 - 14.5 %   Platelets 265 150 - 440 K/uL  Glucose, capillary  Result Value Ref Range   Glucose-Capillary 335 (H) 70 - 99  mg/dL

## 2017-10-22 NOTE — ED Triage Notes (Signed)
Pt arrived with concerns with hyperglycemia for the last few days. Pt states he has been taking metformin as prescribed. Pt is a & o x 4 and denies any pain.

## 2017-12-01 ENCOUNTER — Ambulatory Visit: Payer: Medicare Other | Admitting: Certified Registered Nurse Anesthetist

## 2017-12-01 ENCOUNTER — Encounter: Payer: Self-pay | Admitting: *Deleted

## 2017-12-01 ENCOUNTER — Encounter
Admission: RE | Disposition: A | Discharge status: Home / Self Care | Payer: Self-pay | Source: Ambulatory Visit | Attending: Gastroenterology

## 2017-12-01 ENCOUNTER — Ambulatory Visit
Admission: RE | Admit: 2017-12-01 | Discharge: 2017-12-01 | Disposition: A | Discharge status: Home / Self Care | Payer: Medicare Other | Source: Ambulatory Visit | Attending: Gastroenterology | Admitting: Gastroenterology

## 2017-12-01 DIAGNOSIS — K640 First degree hemorrhoids: Secondary | ICD-10-CM | POA: Insufficient documentation

## 2017-12-01 DIAGNOSIS — Z951 Presence of aortocoronary bypass graft: Secondary | ICD-10-CM | POA: Diagnosis not present

## 2017-12-01 DIAGNOSIS — J449 Chronic obstructive pulmonary disease, unspecified: Secondary | ICD-10-CM | POA: Insufficient documentation

## 2017-12-01 DIAGNOSIS — E785 Hyperlipidemia, unspecified: Secondary | ICD-10-CM | POA: Diagnosis not present

## 2017-12-01 DIAGNOSIS — K219 Gastro-esophageal reflux disease without esophagitis: Secondary | ICD-10-CM | POA: Insufficient documentation

## 2017-12-01 DIAGNOSIS — Z955 Presence of coronary angioplasty implant and graft: Secondary | ICD-10-CM | POA: Diagnosis not present

## 2017-12-01 DIAGNOSIS — I10 Essential (primary) hypertension: Secondary | ICD-10-CM | POA: Insufficient documentation

## 2017-12-01 DIAGNOSIS — F17200 Nicotine dependence, unspecified, uncomplicated: Secondary | ICD-10-CM | POA: Diagnosis not present

## 2017-12-01 DIAGNOSIS — K621 Rectal polyp: Secondary | ICD-10-CM | POA: Diagnosis not present

## 2017-12-01 DIAGNOSIS — Z1211 Encounter for screening for malignant neoplasm of colon: Secondary | ICD-10-CM | POA: Diagnosis not present

## 2017-12-01 DIAGNOSIS — Z7982 Long term (current) use of aspirin: Secondary | ICD-10-CM | POA: Insufficient documentation

## 2017-12-01 DIAGNOSIS — Z7984 Long term (current) use of oral hypoglycemic drugs: Secondary | ICD-10-CM | POA: Diagnosis not present

## 2017-12-01 DIAGNOSIS — K5730 Diverticulosis of large intestine without perforation or abscess without bleeding: Secondary | ICD-10-CM | POA: Insufficient documentation

## 2017-12-01 DIAGNOSIS — Z791 Long term (current) use of non-steroidal anti-inflammatories (NSAID): Secondary | ICD-10-CM | POA: Insufficient documentation

## 2017-12-01 DIAGNOSIS — I2510 Atherosclerotic heart disease of native coronary artery without angina pectoris: Secondary | ICD-10-CM | POA: Diagnosis not present

## 2017-12-01 DIAGNOSIS — Z7902 Long term (current) use of antithrombotics/antiplatelets: Secondary | ICD-10-CM | POA: Insufficient documentation

## 2017-12-01 DIAGNOSIS — M109 Gout, unspecified: Secondary | ICD-10-CM | POA: Insufficient documentation

## 2017-12-01 DIAGNOSIS — Z79899 Other long term (current) drug therapy: Secondary | ICD-10-CM | POA: Diagnosis not present

## 2017-12-01 DIAGNOSIS — D123 Benign neoplasm of transverse colon: Secondary | ICD-10-CM | POA: Insufficient documentation

## 2017-12-01 DIAGNOSIS — D124 Benign neoplasm of descending colon: Secondary | ICD-10-CM | POA: Diagnosis not present

## 2017-12-01 DIAGNOSIS — E119 Type 2 diabetes mellitus without complications: Secondary | ICD-10-CM | POA: Insufficient documentation

## 2017-12-01 HISTORY — PX: COLONOSCOPY WITH PROPOFOL: SHX5780

## 2017-12-01 LAB — URINE DRUG SCREEN, QUALITATIVE (ARMC ONLY)
AMPHETAMINES, UR SCREEN: NOT DETECTED
BENZODIAZEPINE, UR SCRN: NOT DETECTED
Barbiturates, Ur Screen: NOT DETECTED
Cannabinoid 50 Ng, Ur ~~LOC~~: NOT DETECTED
Cocaine Metabolite,Ur ~~LOC~~: NOT DETECTED
MDMA (Ecstasy)Ur Screen: NOT DETECTED
Methadone Scn, Ur: NOT DETECTED
Opiate, Ur Screen: NOT DETECTED
Phencyclidine (PCP) Ur S: NOT DETECTED
TRICYCLIC, UR SCREEN: NOT DETECTED

## 2017-12-01 SURGERY — COLONOSCOPY WITH PROPOFOL
Anesthesia: General

## 2017-12-01 MED ORDER — MIDAZOLAM HCL 2 MG/2ML IJ SOLN
INTRAMUSCULAR | Status: DC | PRN
  Administered 2017-12-01: 2 mg via INTRAVENOUS

## 2017-12-01 MED ORDER — PROPOFOL 500 MG/50ML IV EMUL
INTRAVENOUS | Status: DC | PRN
  Administered 2017-12-01: 140 ug/kg/min via INTRAVENOUS

## 2017-12-01 MED ORDER — PROPOFOL 500 MG/50ML IV EMUL
INTRAVENOUS | Status: AC
  Filled 2017-12-01: qty 50

## 2017-12-01 MED ORDER — MIDAZOLAM HCL 2 MG/2ML IJ SOLN
INTRAMUSCULAR | Status: AC
  Filled 2017-12-01: qty 2

## 2017-12-01 MED ORDER — LIDOCAINE HCL (CARDIAC) PF 100 MG/5ML IV SOSY
PREFILLED_SYRINGE | INTRAVENOUS | Status: DC | PRN
  Administered 2017-12-01: 50 mg via INTRAVENOUS

## 2017-12-01 MED ORDER — LIDOCAINE HCL (PF) 2 % IJ SOLN
INTRAMUSCULAR | Status: AC
  Filled 2017-12-01: qty 10

## 2017-12-01 MED ORDER — PROPOFOL 10 MG/ML IV BOLUS
INTRAVENOUS | Status: DC | PRN
  Administered 2017-12-01: 100 mg via INTRAVENOUS

## 2017-12-01 MED ORDER — SODIUM CHLORIDE 0.9 % IV SOLN
INTRAVENOUS | Status: DC
  Administered 2017-12-01: 1000 mL via INTRAVENOUS

## 2017-12-01 NOTE — H&P (Signed)
Outpatient short stay form Pre-procedure 12/01/2017 8:07 AM Lollie Sails MD  Primary Physician: [place] Pink, MD  Reason for visit: Colonoscopy  History of present illness: Patient is a 57 year old male presenting today as above.  He has a personal history of coronary artery bypass grafting and was previously on Brilinta.  He currently only takes a 81 mg aspirin as a blood thinner.  He takes no other aspirin product.  Tolerated his prep well.   No current facility-administered medications for this encounter.   Medications Prior to Admission  Medication Sig Dispense Refill Last Dose  . aspirin EC 81 MG tablet Take 81 mg by mouth daily.   Past Week at Unknown time  . atorvastatin (LIPITOR) 80 MG tablet Take 80 mg by mouth daily.   Past Week at Unknown time  . carvedilol (COREG) 12.5 MG tablet Take 12.5 mg by mouth 2 (two) times daily with a meal.   Past Week at Unknown time  . celecoxib (CELEBREX) 100 MG capsule Take 100 mg by mouth 2 (two) times daily.   Past Month at Unknown time  . COLCRYS 0.6 MG tablet Take 1 tablet by mouth as needed. For acute gout - repeat dose after 1 to 2 hours if needed  0 Past Week at Unknown time  . glipiZIDE (GLUCOTROL) 5 MG tablet Take 1 tablet (5 mg total) by mouth daily. 60 tablet 0 Past Week at Unknown time  . lisinopril (PRINIVIL,ZESTRIL) 10 MG tablet Take 10 mg by mouth daily.  0 Past Week at Unknown time  . metFORMIN (GLUCOPHAGE) 1000 MG tablet Take 1,000 mg by mouth 2 (two) times daily.  0 Past Week at Unknown time  . nitroGLYCERIN (NITROSTAT) 0.4 MG SL tablet Place 1 tablet (0.4 mg total) under the tongue every 5 (five) minutes as needed for chest pain. 15 tablet 2 Past Month at prnnknown time  . metFORMIN (GLUCOPHAGE) 500 MG tablet Take 1 tablet (500 mg total) by mouth 2 (two) times daily with a meal. (Patient not taking: Reported on 12/01/2017) 180 tablet 1 Completed Course at Unknown time  . pantoprazole (PROTONIX) 40 MG tablet Take 40 mg by mouth  daily.  0 Not Taking at Unknown time  . ticagrelor (BRILINTA) 90 MG TABS tablet Take by mouth 2 (two) times daily.   Not Taking at Unknown time     No Known Allergies   Past Medical History:  Diagnosis Date  . Diabetes mellitus without complication ([place])   . Drug abuse ([place])   . GERD (gastroesophageal reflux disease)   . Gout   . Gout   . Hyperlipemia   . Hypertension     Review of systems:      Physical Exam    Heart and lungs: Rhythm without rub or gallop, lungs are bilaterally clear.    HEENT: Normal cephalic atraumatic eyes are anicteric    Other:    Pertinant exam for procedure: Soft nontender nondistended bowel sounds positive normoactive    Planned proceedures: Colonoscopy and indicated procedures. I have discussed the risks benefits and complications of procedures to include not limited to bleeding, infection, perforation and the risk of sedation and the patient wishes to proceed.    Lollie Sails, MD Gastroenterology 12/01/2017  8:07 AM

## 2017-12-01 NOTE — Transfer of Care (Signed)
Immediate Anesthesia Transfer of Care Note  Patient: Victor Ross  Procedure(s) Performed: COLONOSCOPY WITH PROPOFOL (N/A )  Patient Location: PACU and Endoscopy Unit  Anesthesia Type:General  Level of Consciousness: drowsy  Airway & Oxygen Therapy: Patient Spontanous Breathing and Patient connected to nasal cannula oxygen  Post-op Assessment: Report given to RN and Post -op Vital signs reviewed and stable  Post vital signs: Reviewed and stable  Last Vitals:  Vitals Value Taken Time  BP 136/107 12/01/2017  9:32 AM  Temp    Pulse 87 12/01/2017  9:32 AM  Resp 12 12/01/2017  9:32 AM  SpO2 96 % 12/01/2017  9:32 AM  Vitals shown include unvalidated device data.  Last Pain:  Vitals:   12/01/17 0759  TempSrc: Tympanic         Complications: No apparent anesthesia complications

## 2017-12-01 NOTE — Anesthesia Post-op Follow-up Note (Signed)
Anesthesia QCDR form completed.        

## 2017-12-01 NOTE — Op Note (Signed)
[facility] Gastroenterology Patient Name: Victor Ross Procedure Date: 12/01/2017 8:12 AM MRN: [id number] Account #: [id number] Date of Birth: 1960/11/19 Admit Type: Outpatient Age: 57 Room: [facility] ENDO ROOM 2 Gender: Male Note Status: Finalized Procedure:            Colonoscopy Indications:          Screening for colorectal malignant neoplasm Providers:            Lollie Sails, MD Referring MD:         Irven Easterly. Kary Kos, MD (Referring MD) Medicines:            Monitored Anesthesia Care Complications:        No immediate complications. Procedure:            Pre-Anesthesia Assessment:                       - ASA Grade Assessment: III - A patient with severe                        systemic disease.                       After obtaining informed consent, the colonoscope was                        passed under direct vision. Throughout the procedure,                        the patient's blood pressure, pulse, and oxygen                        saturations were monitored continuously. The                        Colonoscope was introduced through the anus and                        advanced to the the cecum, identified by appendiceal                        orifice and ileocecal valve. The colonoscopy was                        performed with moderate difficulty due to significant                        looping. Successful completion of the procedure was                        aided by changing the patient to a supine position and                        using manual pressure. The patient tolerated the                        procedure well. The quality of the bowel preparation                        was good. Findings:      Four sessile polyps were found in the rectum. The polyps were 1  to 3 mm       in size. These polyps were removed with a cold biopsy forceps. Resection       and retrieval were complete.      A 3 mm polyp was found in the proximal transverse  colon. The polyp was       sessile. The polyp was removed with a cold biopsy forceps. Resection and       retrieval were complete.      A 3 mm polyp was found in the transverse colon. The polyp was sessile.       The polyp was removed with a cold biopsy forceps. Resection and       retrieval were complete.      Two sessile polyps were found in the descending colon. The polyps were 2       to 3 mm in size. These polyps were removed with a cold biopsy forceps.       Resection and retrieval were complete.      Multiple small-mouthed diverticula were found in the sigmoid colon,       descending colon and transverse colon.      Non-bleeding internal hemorrhoids were found during retroflexion and       during anoscopy. The hemorrhoids were small and Grade I (internal       hemorrhoids that do not prolapse).      The digital rectal exam was normal. Impression:           - Four 1 to 3 mm polyps in the rectum, removed with a                        cold biopsy forceps. Resected and retrieved.                       - One 3 mm polyp in the proximal transverse colon,                        removed with a cold biopsy forceps. Resected and                        retrieved.                       - One 3 mm polyp in the transverse colon, removed with                        a cold biopsy forceps. Resected and retrieved.                       - Two 2 to 3 mm polyps in the descending colon, removed                        with a cold biopsy forceps. Resected and retrieved.                       - Diverticulosis in the sigmoid colon, in the                        descending colon and in the transverse colon.                       - Non-bleeding  internal hemorrhoids. Recommendation:       - Discharge patient to home.                       - Await pathology results.                       - Telephone GI clinic for pathology results in 1 week. Procedure Code(s):    --- Professional ---                        [phone], Colonoscopy, flexible; with biopsy, single or                        multiple Diagnosis Code(s):    --- Professional ---                       Z12.11, Encounter for screening for malignant neoplasm                        of colon                       K62.1, Rectal polyp                       D12.4, Benign neoplasm of descending colon                       D12.3, Benign neoplasm of transverse colon (hepatic                        flexure or splenic flexure)                       K64.0, First degree hemorrhoids                       K57.30, Diverticulosis of large intestine without                        perforation or abscess without bleeding CPT copyright 2017 American Medical Association. All rights reserved. The codes documented in this report are preliminary and upon coder review may  be revised to meet current compliance requirements. Lollie Sails, MD 12/01/2017 9:32:01 AM This report has been signed electronically. Number of Addenda: 0 Note Initiated On: 12/01/2017 8:12 AM Scope Withdrawal Time: 0 hours 15 minutes 29 seconds  Total Procedure Duration: 0 hours 35 minutes 37 seconds       [facility]

## 2017-12-01 NOTE — Anesthesia Postprocedure Evaluation (Signed)
Anesthesia Post Note  Patient: Victor Ross  Procedure(s) Performed: COLONOSCOPY WITH PROPOFOL (N/A )  Patient location during evaluation: Endoscopy Anesthesia Type: General Level of consciousness: awake and alert Pain management: pain level controlled Vital Signs Assessment: post-procedure vital signs reviewed and stable Respiratory status: spontaneous breathing, nonlabored ventilation, respiratory function stable and patient connected to nasal cannula oxygen Cardiovascular status: blood pressure returned to baseline and stable Postop Assessment: no apparent nausea or vomiting Anesthetic complications: no     Last Vitals:  Vitals:   12/01/17 1003 12/01/17 1014  BP: (!) 139/97 (!) 147/99  Pulse: 70 72  Resp: 18 18  Temp:    SpO2: 98% 98%    Last Pain:  Vitals:   12/01/17 1014  TempSrc:   PainSc: 0-No pain                 Precious Haws Sorayah Schrodt

## 2017-12-01 NOTE — Anesthesia Preprocedure Evaluation (Signed)
Anesthesia Evaluation  Patient identified by MRN, date of birth, ID band Patient awake    Reviewed: Allergy & Precautions, H&P , NPO status , Patient's Chart, lab work & pertinent test results  History of Anesthesia Complications Negative for: history of anesthetic complications  Airway Mallampati: III  TM Distance: >3 FB Neck ROM: full    Dental  (+) Chipped, Poor Dentition   Pulmonary neg shortness of breath, COPD, Current Smoker,           Cardiovascular Exercise Tolerance: Good hypertension, (-) angina+ CAD and + CABG  (-) Past MI and (-) DOE      Neuro/Psych negative neurological ROS  negative psych ROS   GI/Hepatic Neg liver ROS, GERD  Medicated and Controlled,  Endo/Other  diabetes, Type 2  Renal/GU negative Renal ROS  negative genitourinary   Musculoskeletal   Abdominal   Peds  Hematology negative hematology ROS (+)   Anesthesia Other Findings Past Medical History: No date: Diabetes mellitus without complication (HCC) No date: Drug abuse ([place]) No date: GERD (gastroesophageal reflux disease) No date: Gout No date: Gout No date: Hyperlipemia No date: Hypertension  Past Surgical History: 03/2015: CORONARY ANGIOPLASTY WITH STENT PLACEMENT; Left No date: CORONARY ARTERY BYPASS GRAFT  BMI    Body Mass Index:  34.56 kg/m      Reproductive/Obstetrics negative OB ROS                             Anesthesia Physical Anesthesia Plan  ASA: III  Anesthesia Plan: General   Post-op Pain Management:    Induction: Intravenous  PONV Risk Score and Plan: Propofol infusion and TIVA  Airway Management Planned: Natural Airway and Nasal Cannula  Additional Equipment:   Intra-op Plan:   Post-operative Plan:   Informed Consent: I have reviewed the patients History and Physical, chart, labs and discussed the procedure including the risks, benefits and alternatives for the  proposed anesthesia with the patient or authorized representative who has indicated his/her understanding and acceptance.   Dental Advisory Given  Plan Discussed with: Anesthesiologist, CRNA and Surgeon  Anesthesia Plan Comments: (Patient consented for risks of anesthesia including but not limited to:  - adverse reactions to medications - risk of intubation if required - damage to teeth, lips or other oral mucosa - sore throat or hoarseness - Damage to heart, brain, lungs or loss of life  Patient voiced understanding.)        Anesthesia Quick Evaluation

## 2017-12-04 LAB — SURGICAL PATHOLOGY

## 2017-12-07 ENCOUNTER — Encounter: Payer: Medicare Other | Attending: Student | Admitting: *Deleted

## 2017-12-16 ENCOUNTER — Emergency Department: Payer: Medicare Other

## 2017-12-16 ENCOUNTER — Other Ambulatory Visit: Payer: Self-pay

## 2017-12-16 ENCOUNTER — Emergency Department
Admission: EM | Admit: 2017-12-16 | Discharge: 2017-12-16 | Disposition: A | Discharge status: Home / Self Care | Payer: Medicare Other | Attending: Emergency Medicine | Admitting: Emergency Medicine

## 2017-12-16 DIAGNOSIS — I2510 Atherosclerotic heart disease of native coronary artery without angina pectoris: Secondary | ICD-10-CM | POA: Diagnosis not present

## 2017-12-16 DIAGNOSIS — Z7982 Long term (current) use of aspirin: Secondary | ICD-10-CM | POA: Diagnosis not present

## 2017-12-16 DIAGNOSIS — Y939 Activity, unspecified: Secondary | ICD-10-CM | POA: Diagnosis not present

## 2017-12-16 DIAGNOSIS — E119 Type 2 diabetes mellitus without complications: Secondary | ICD-10-CM | POA: Insufficient documentation

## 2017-12-16 DIAGNOSIS — M7041 Prepatellar bursitis, right knee: Secondary | ICD-10-CM | POA: Insufficient documentation

## 2017-12-16 DIAGNOSIS — R2241 Localized swelling, mass and lump, right lower limb: Secondary | ICD-10-CM | POA: Diagnosis present

## 2017-12-16 DIAGNOSIS — I10 Essential (primary) hypertension: Secondary | ICD-10-CM | POA: Diagnosis not present

## 2017-12-16 DIAGNOSIS — Z7984 Long term (current) use of oral hypoglycemic drugs: Secondary | ICD-10-CM | POA: Insufficient documentation

## 2017-12-16 LAB — BASIC METABOLIC PANEL
Anion gap: 10 (ref 5–15)
BUN: 10 mg/dL (ref 6–20)
CALCIUM: 9.2 mg/dL (ref 8.9–10.3)
CO2: 25 mmol/L (ref 22–32)
CREATININE: 0.93 mg/dL (ref 0.61–1.24)
Chloride: 103 mmol/L (ref 98–111)
Glucose, Bld: 172 mg/dL — ABNORMAL HIGH (ref 70–99)
Potassium: 4 mmol/L (ref 3.5–5.1)
SODIUM: 138 mmol/L (ref 135–145)

## 2017-12-16 LAB — SEDIMENTATION RATE: SED RATE: 5 mm/h (ref 0–20)

## 2017-12-16 LAB — CBC WITH DIFFERENTIAL/PLATELET
BASOS PCT: 1 %
Basophils Absolute: 0.1 10*3/uL (ref 0–0.1)
EOS ABS: 0.1 10*3/uL (ref 0–0.7)
EOS PCT: 1 %
HCT: 44.9 % (ref 40.0–52.0)
HEMOGLOBIN: 15.6 g/dL (ref 13.0–18.0)
Lymphocytes Relative: 36 %
Lymphs Abs: 3.4 10*3/uL (ref 1.0–3.6)
MCH: 31.2 pg (ref 26.0–34.0)
MCHC: 34.8 g/dL (ref 32.0–36.0)
MCV: 89.8 fL (ref 80.0–100.0)
Monocytes Absolute: 0.7 10*3/uL (ref 0.2–1.0)
Monocytes Relative: 7 %
NEUTROS PCT: 55 %
Neutro Abs: 5.2 10*3/uL (ref 1.4–6.5)
PLATELETS: 267 10*3/uL (ref 150–440)
RBC: 5 MIL/uL (ref 4.40–5.90)
RDW: 13.5 % (ref 11.5–14.5)
WBC: 9.4 10*3/uL (ref 3.8–10.6)

## 2017-12-16 LAB — URIC ACID: Uric Acid, Serum: 6 mg/dL (ref 3.7–8.6)

## 2017-12-16 MED ORDER — KETOROLAC TROMETHAMINE 10 MG PO TABS: 10.0000 mg | ORAL_TABLET | Freq: Four times a day (QID) | ORAL | 0 refills | Status: AC | PRN

## 2017-12-16 MED ORDER — TRAMADOL HCL 50 MG PO TABS: 50.0000 mg | ORAL_TABLET | Freq: Two times a day (BID) | ORAL | 0 refills | Status: AC | PRN

## 2017-12-16 MED ORDER — KETOROLAC TROMETHAMINE 60 MG/2ML IM SOLN
60.0000 mg | Freq: Once | INTRAMUSCULAR | Status: AC
  Administered 2017-12-16: 60 mg via INTRAMUSCULAR
  Filled 2017-12-16: qty 2

## 2017-12-16 NOTE — ED Provider Notes (Signed)
[facility] Emergency Department Provider Note   ____________________________________________   First MD Initiated Contact with Patient 12/16/17 [phone]     (approximate)  I have reviewed the triage vital signs and the nursing notes.   HISTORY  Chief Complaint Knee Pain    HPI Victor Ross is a 57 y.o. male patient presents with edema to the suprapatellar area of the right knee.  Patient state he believes having a gout attack which started last week.  Patient state he took colchicine in the myosin from previous prescription but as the swelling is still present.  Patient did pain increased with laying down.  Patient rates his pain as a 4/10.  Patient described the pain is "aching".  No pelvis measured for complaint.  Past Medical History:  Diagnosis Date  . Diabetes mellitus without complication ([place])   . Drug abuse ([place])   . GERD (gastroesophageal reflux disease)   . Gout   . Gout   . Hyperlipemia   . Hypertension     Patient Active Problem List   Diagnosis Date Noted  . GIB (gastrointestinal bleeding) 04/19/2017  . Encounter to establish care 02/18/2016  . CAD (coronary artery disease) of artery bypass graft 02/18/2016  . Diabetes ([place]) 02/18/2016    Past Surgical History:  Procedure Laterality Date  . COLONOSCOPY WITH PROPOFOL N/A 12/01/2017   Procedure: COLONOSCOPY WITH PROPOFOL;  Surgeon: Lollie Sails, MD;  Location: [facility] ENDOSCOPY;  Service: Endoscopy;  Laterality: N/A;  . CORONARY ANGIOPLASTY WITH STENT PLACEMENT Left 03/2015  . CORONARY ARTERY BYPASS GRAFT      Prior to Admission medications   Medication Sig Start Date End Date Taking? Authorizing Provider  aspirin EC 81 MG tablet Take 81 mg by mouth daily.    [provider]  atorvastatin (LIPITOR) 80 MG tablet Take 80 mg by mouth daily.    [provider]  carvedilol (COREG) 12.5 MG tablet Take 12.5 mg by mouth 2 (two) times daily with a meal.    [provider]  celecoxib (CELEBREX) 100 MG capsule Take 100 mg by mouth 2 (two) times daily.    [provider]  COLCRYS 0.6 MG tablet Take 1 tablet by mouth as needed. For acute gout - repeat dose after 1 to 2 hours if needed 02/13/17   [provider]  glipiZIDE (GLUCOTROL) 5 MG tablet Take 1 tablet (5 mg total) by mouth daily. 10/22/17 10/22/18  Darel Hong, MD  ketorolac (TORADOL) 10 MG tablet Take 1 tablet (10 mg total) by mouth every 6 (six) hours as needed. 12/16/17   Sable Feil, PA-C  lisinopril (PRINIVIL,ZESTRIL) 10 MG tablet Take 10 mg by mouth daily. 04/10/17   [provider]  metFORMIN (GLUCOPHAGE) 1000 MG tablet Take 1,000 mg by mouth 2 (two) times daily. 04/10/17   [provider]  metFORMIN (GLUCOPHAGE) 500 MG tablet Take 1 tablet (500 mg total) by mouth 2 (two) times daily with a meal. Patient not taking: Reported on 12/01/2017 02/18/16   Micheline Chapman, NP  nitroGLYCERIN (NITROSTAT) 0.4 MG SL tablet Place 1 tablet (0.4 mg total) under the tongue every 5 (five) minutes as needed for chest pain. 02/18/16   Micheline Chapman, NP  pantoprazole (PROTONIX) 40 MG tablet Take 40 mg by mouth daily. 03/28/17   [provider]  ticagrelor (BRILINTA) 90 MG TABS tablet Take by mouth 2 (two) times daily.    [provider]  traMADol (ULTRAM) 50 MG tablet  Take 1 tablet (50 mg total) by mouth every 12 (twelve) hours as needed. 12/16/17   Sable Feil, PA-C    Allergies Patient has no known allergies.  Family History  Problem Relation Age of Onset  . Cancer Father     Social History Social History   Tobacco Use  . Smoking status: Current Every Day Smoker  . Smokeless tobacco: Former Network engineer Use Topics  . Alcohol use: Not Currently    Comment: currently not using  . Drug use: Not Currently    Types: Cocaine    Comment: off 2 years    Review of Systems Constitutional: No fever/chills Eyes: No visual  changes. ENT: No sore throat. Cardiovascular: Denies chest pain. Respiratory: Denies shortness of breath. Gastrointestinal: No abdominal pain.  No nausea, no vomiting.  No diarrhea.  No constipation. Genitourinary: Negative for dysuria. Musculoskeletal: Negative for back pain. Skin: Negative for rash. Neurological: Negative for headaches, focal weakness or numbness. Endocrine:Diabetes, hyperlipidemia, hypertension. ____________________________________________   PHYSICAL EXAM:  VITAL SIGNS: ED Triage Vitals  Enc Vitals Group     BP 12/16/17 0817 (!) 148/88     Pulse Rate 12/16/17 0817 82     Resp 12/16/17 0817 16     Temp 12/16/17 0817 98.2 F (36.8 C)     Temp Source 12/16/17 0817 Oral     SpO2 12/16/17 0817 98 %     Weight 12/16/17 0818 233 lb 11 oz (106 kg)     Height 12/16/17 0818 5\' 9"  (1.753 m)     Head Circumference --      Peak Flow --      Pain Score 12/16/17 0817 4     Pain Loc --      Pain Edu? --      Excl. in [place]? --    Constitutional: Alert and oriented. Well appearing and in no acute distress. Cardiovascular: Normal rate, regular rhythm. Grossly normal heart sounds.  Good peripheral circulation. Respiratory: Normal respiratory effort.  No retractions. Lungs CTAB. Musculoskeletal: No obvious deformity to the right knee.  Superior patella edema is apparent.  Mild erythema and crepitus with palpation.. Neurologic:  Normal speech and language. No gross focal neurologic deficits are appreciated. No gait instability. Skin:  Skin is warm, dry and intact. No rash noted. Psychiatric: Mood and affect are normal. Speech and behavior are normal.  ____________________________________________   LABS (all labs ordered are listed, but only abnormal results are displayed)  Labs Reviewed  BASIC METABOLIC PANEL - Abnormal; Notable for the following components:      Result Value   Glucose, Bld 172 (*)    All other components within normal limits  CBC WITH  DIFFERENTIAL/PLATELET  SEDIMENTATION RATE  URIC ACID   ____________________________________________  EKG   ____________________________________________  RADIOLOGY  ED MD interpretation:    Official radiology report(s): Dg Knee Complete 4 Views Right  Result Date: 12/16/2017 CLINICAL DATA:  Pain.  History of gout EXAM: RIGHT KNEE - COMPLETE 4+ VIEW COMPARISON:  None. FINDINGS: Frontal, lateral, and bilateral oblique views were obtained. There is no evident fracture or dislocation. No joint effusion. There is moderate joint space narrowing laterally and in the patellofemoral joint region. There is spurring in all compartments. There is no erosive change or intra-articular calcification. Postoperative changes are noted in the soft tissues medially. IMPRESSION: Areas of osteoarthritic change with narrowing most marked laterally and in the patellofemoral joint. No erosive changes or intra-articular calcification. No fracture or joint effusion.  Postoperative changes in the soft tissues medially noted. Electronically Signed   By: Lowella Grip III M.D.   On: 12/16/2017 11:27    ____________________________________________   PROCEDURES  Procedure(s) performed: None  Procedures  Critical Care performed: No  ____________________________________________   INITIAL IMPRESSION / ASSESSMENT AND PLAN / ED COURSE  As part of my medical decision making, I reviewed the following data within the electronic MEDICAL RECORD NUMBER    Right knee pain secondary to bursitis and degenerative changes.  Discussed x-ray findings with patient.  Patient given discharge care instructions.  Patient advised to ambulate with question take medication as directed.  Advised to follow orthopedic by calling for an appointment for definitive evaluation and treatment.     ____________________________________________   FINAL CLINICAL IMPRESSION(S) / ED DIAGNOSES  Final diagnoses:  Prepatellar bursitis of right  knee     ED Discharge Orders         Ordered    ketorolac (TORADOL) 10 MG tablet  Every 6 hours PRN     12/16/17 1144    traMADol (ULTRAM) 50 MG tablet  Every 12 hours PRN     12/16/17 1144           Note:  This document was prepared using Dragon voice recognition software and may include unintentional dictation errors.    Sable Feil, PA-C 12/16/17 1155    Carrie Mew, MD 12/19/17 [phone]

## 2017-12-16 NOTE — ED Notes (Signed)
Crutches provided to pt

## 2017-12-16 NOTE — ED Triage Notes (Addendum)
Right knee pain, pt has hx of gout-took his "gout pill" last week. Pt states that he thinks the "gout is gone but the swelling is still there". Pt ambulates with small limp. Pain has improved since last week but still 4/10. Worse with lying down.  Pt alert and oriented X4, active, cooperative, pt in NAD. RR even and unlabored, color WNL.  Pt diabetic, CBG's running 140-normal for patient.

## 2017-12-16 NOTE — Discharge Instructions (Addendum)
Follow discharge care instruction, take medication as directed, and ambulate with crutches as needed.  Contact orthopedic department 5 days to schedule appointment for definitive evaluation and treatment.  Your right affected knee was 17 inches in circumference versus 16 inches of the nonaffected left knee.

## 2017-12-16 NOTE — ED Notes (Signed)
See triage note.states he developed pain and swelling to right knee  States he thought it was gout   So he started taking his meds  Now able to bend his knee but conts; to have pain and swelling   Denies any injury

## 2018-03-26 ENCOUNTER — Other Ambulatory Visit: Payer: Self-pay

## 2018-03-26 ENCOUNTER — Emergency Department
Admission: EM | Admit: 2018-03-26 | Discharge: 2018-03-26 | Disposition: A | Discharge status: Home / Self Care | Payer: Medicare Other | Attending: Emergency Medicine | Admitting: Emergency Medicine

## 2018-03-26 DIAGNOSIS — I2510 Atherosclerotic heart disease of native coronary artery without angina pectoris: Secondary | ICD-10-CM | POA: Insufficient documentation

## 2018-03-26 DIAGNOSIS — I10 Essential (primary) hypertension: Secondary | ICD-10-CM | POA: Diagnosis not present

## 2018-03-26 DIAGNOSIS — R81 Glycosuria: Secondary | ICD-10-CM | POA: Insufficient documentation

## 2018-03-26 DIAGNOSIS — E162 Hypoglycemia, unspecified: Secondary | ICD-10-CM

## 2018-03-26 DIAGNOSIS — Z7984 Long term (current) use of oral hypoglycemic drugs: Secondary | ICD-10-CM | POA: Insufficient documentation

## 2018-03-26 DIAGNOSIS — Z955 Presence of coronary angioplasty implant and graft: Secondary | ICD-10-CM | POA: Diagnosis not present

## 2018-03-26 DIAGNOSIS — E11649 Type 2 diabetes mellitus with hypoglycemia without coma: Secondary | ICD-10-CM | POA: Diagnosis not present

## 2018-03-26 DIAGNOSIS — F17200 Nicotine dependence, unspecified, uncomplicated: Secondary | ICD-10-CM | POA: Insufficient documentation

## 2018-03-26 LAB — BASIC METABOLIC PANEL
Anion gap: 9 (ref 5–15)
BUN: 12 mg/dL (ref 6–20)
CO2: 26 mmol/L (ref 22–32)
CREATININE: 1.03 mg/dL (ref 0.61–1.24)
Calcium: 9.4 mg/dL (ref 8.9–10.3)
Chloride: 101 mmol/L (ref 98–111)
GFR calc Af Amer: >60 mL/min (ref >60)
Glucose, Bld: 103 mg/dL — ABNORMAL HIGH (ref 70–99)
Potassium: 4.8 mmol/L (ref 3.5–5.1)
SODIUM: 136 mmol/L (ref 135–145)

## 2018-03-26 LAB — CBC
HEMATOCRIT: 48.4 % (ref 39.0–52.0)
Hemoglobin: 15.9 g/dL (ref 13.0–17.0)
MCH: 29.3 pg (ref 26.0–34.0)
MCHC: 32.9 g/dL (ref 30.0–36.0)
MCV: 89.1 fL (ref 80.0–100.0)
Platelets: 302 10*3/uL (ref 150–400)
RBC: 5.43 MIL/uL (ref 4.22–5.81)
RDW: 13.2 % (ref 11.5–15.5)
WBC: 10.4 10*3/uL (ref 4.0–10.5)
nRBC: 0 % (ref 0.0–0.2)

## 2018-03-26 LAB — URINALYSIS, COMPLETE (UACMP) WITH MICROSCOPIC
Bacteria, UA: NONE SEEN
Bilirubin Urine: NEGATIVE
Glucose, UA: >=500 mg/dL — AB
Hgb urine dipstick: NEGATIVE
Ketones, ur: NEGATIVE mg/dL
Leukocytes, UA: NEGATIVE
Nitrite: NEGATIVE
Protein, ur: NEGATIVE mg/dL
Specific Gravity, Urine: 1.021 (ref 1.005–1.030)
Squamous Epithelial / HPF: NONE SEEN (ref 0–5)
pH: 5 (ref 5.0–8.0)

## 2018-03-26 LAB — GLUCOSE, CAPILLARY: GLUCOSE-CAPILLARY: 97 mg/dL (ref 70–99)

## 2018-03-26 LAB — TROPONIN I: Troponin I: <0.03 ng/mL (ref <0.03)

## 2018-03-26 NOTE — ED Triage Notes (Signed)
First Nurse Note:  C/O liable CBG for past several days.  States CBG was 64 PTA.  Takes oral agents for DM.  AAOx3.  Skin warm and dry. NAD

## 2018-03-26 NOTE — Discharge Instructions (Addendum)
Please continue to take your metformin over the weekend.  Stop taking Glipizide and Jardiance for now but do not throw away your prescriptions.  Please monitor your blood sugar 3 times a day before meals and bring the record of the results with you to your primary care doctor's appointment.  Return to the emergency department if you develop low blood sugars that do not improve with eating or drinking, lightheadedness or fainting, fever, chest pain, or any other symptoms concerning to you.

## 2018-03-26 NOTE — ED Notes (Signed)
Pt reports drinking orange juice prior to arrival.

## 2018-03-26 NOTE — ED Notes (Signed)
Pt A&Ox4, skin warm and dry, states was feeling weak and "like low blood sugar".

## 2018-03-26 NOTE — ED Triage Notes (Signed)
Pt arrived via POV with reports of low blood sugars off and on for the past week, reports CBG was in the 60s prior to arrival.  Pt currently taking glipizide, metformin and Jardiance.    Pt last took meds this morning. Pt reports he has been eating well and takes his medications with food.

## 2018-03-26 NOTE — ED Provider Notes (Signed)
[facility] Emergency Department Provider Note  ____________________________________________  Time seen: Approximately 7:36 PM  I have reviewed the triage vital signs and the nursing notes.   HISTORY  Chief Complaint Hypoglycemia    HPI Victor Ross is a 58 y.o. male with a history of type 2 diabetes, HTN, HL, presenting for symptomatic hypoglycemia.  The patient reports that for 1 week, he has been having episodes where he gets sweaty, and his stomach "turns around in knots."  He has noted that he has blood sugars that are low for him, such as 61 and 87.  He normally takes metformin, glipizide and Jardiance.  He saw his primary care physician yesterday, who decreased his glipizide in half to 5 mg.  Today, he took his new regimen, and had another episode of hypoglycemia with a blood sugar in the 80s that was symptomatic.  He has not been having any chest pain, urinary symptoms, fevers or chills, cough or cold symptoms.  He has not had any recent weight loss.  Past Medical History:  Diagnosis Date  . Diabetes mellitus without complication ([place])   . Drug abuse ([place])   . GERD (gastroesophageal reflux disease)   . Gout   . Gout   . Hyperlipemia   . Hypertension     Patient Active Problem List   Diagnosis Date Noted  . GIB (gastrointestinal bleeding) 04/19/2017  . Encounter to establish care 02/18/2016  . CAD (coronary artery disease) of artery bypass graft 02/18/2016  . Diabetes ([place]) 02/18/2016    Past Surgical History:  Procedure Laterality Date  . COLONOSCOPY WITH PROPOFOL N/A 12/01/2017   Procedure: COLONOSCOPY WITH PROPOFOL;  Surgeon: Lollie Sails, MD;  Location: [facility] ENDOSCOPY;  Service: Endoscopy;  Laterality: N/A;  . CORONARY ANGIOPLASTY WITH STENT PLACEMENT Left 03/2015  . CORONARY ARTERY BYPASS GRAFT      Current Outpatient Rx  . Order #: [id number] Class: Historical Med  . Order #: [id number] Class: Historical Med  . Order #:  [id number] Class: Historical Med  . Order #: [id number] Class: Historical Med  . Order #: [id number] Class: Historical Med  . Order #: [id number] Class: Print  . Order #: [id number] Class: Normal  . Order #: [id number] Class: Historical Med  . Order #: [id number] Class: Historical Med  . Order #: [id number] Class: Normal  . Order #: [id number] Class: Normal  . Order #: [id number] Class: Historical Med  . Order #: [id number] Class: Historical Med  . Order #: [id number] Class: Normal    Allergies Patient has no known allergies.  Family History  Problem Relation Age of Onset  . Cancer Father     Social History Social History   Tobacco Use  . Smoking status: Current Every Day Smoker  . Smokeless tobacco: Former Network engineer Use Topics  . Alcohol use: Not Currently    Comment: currently not using  . Drug use: Not Currently    Types: Cocaine    Comment: off 2 years    Review of Systems Constitutional: No fever/chills.  No lightheadedness or syncope.  Positive diaphoresis. Eyes: No visual changes. ENT: No sore throat. No congestion or rhinorrhea. Cardiovascular: Denies chest pain. Denies palpitations. Respiratory: Denies shortness of breath.  No cough. Gastrointestinal: No abdominal pain.  Positive nausea, no vomiting.  No diarrhea.  No constipation. Genitourinary: Negative for dysuria. Musculoskeletal: Negative for back pain. Skin: Negative for rash. Neurological: Negative for headaches. No focal numbness, tingling or weakness.  Endocrine:As of hypoglycemia.    ____________________________________________   PHYSICAL EXAM:  VITAL SIGNS: ED Triage Vitals  Enc Vitals Group     BP 03/26/18 1813 (!) 141/78     Pulse --      Resp 03/26/18 1813 20     Temp 03/26/18 1813 98.2 F (36.8 C)     Temp Source 03/26/18 1813 Oral     SpO2 --      Weight 03/26/18 1817 274 lb (124.3 kg)     Height 03/26/18 1817 5\' 9"  (1.753 m)     Head Circumference --      Peak Flow --      Pain Score  03/26/18 1812 0     Pain Loc --      Pain Edu? --      Excl. in [place]? --     Constitutional: Alert and oriented. Marland Kitchen Answers questions appropriately. Eyes: Conjunctivae are normal.  EOMI. No scleral icterus. Head: Atraumatic. Nose: No congestion/rhinnorhea. Mouth/Throat: Mucous membranes are moist.  Neck: No stridor.  Supple.   Cardiovascular: Normal rate, regular rhythm. No murmurs, rubs or gallops.  Respiratory: Normal respiratory effort.  No accessory muscle use or retractions. Lungs CTAB.  No wheezes, rales or ronchi. Gastrointestinal: Obese.  Soft, nontender and nondistended.  No guarding or rebound.  No peritoneal signs. Musculoskeletal: No LE edema. No ttp in the calves or palpable cords.  Negative Homan's sign. Neurologic:  A&Ox3.  Speech is clear.  Face and smile are symmetric.  EOMI.  Moves all extremities well. Skin:  Skin is warm, dry and intact. No rash noted. Psychiatric: Mood and affect are normal. Speech and behavior are normal.  Normal judgement.  ____________________________________________   LABS (all labs ordered are listed, but only abnormal results are displayed)  Labs Reviewed  BASIC METABOLIC PANEL - Abnormal; Notable for the following components:      Result Value   Glucose, Bld 103 (*)    All other components within normal limits  GLUCOSE, CAPILLARY  CBC  TROPONIN I  URINALYSIS, COMPLETE (UACMP) WITH MICROSCOPIC  CBG MONITORING, ED   ____________________________________________  EKG  ED ECG REPORT I, Anne-Caroline Mariea Clonts, the attending physician, personally viewed and interpreted this ECG.   Date: 03/26/2018  EKG Time: 2018  Rate: 62  Rhythm: normal sinus rhythm; LBBB  Axis: normal  Intervals:none  ST&T Change: No STEMI  This EKG is compared to prior EKGs, and is grossly unchanged.  ____________________________________________  RADIOLOGY  No results found.  ____________________________________________   PROCEDURES  Procedure(s)  performed: None  Procedures  Critical Care performed: No ____________________________________________   INITIAL IMPRESSION / ASSESSMENT AND PLAN / ED COURSE  Pertinent labs & imaging results that were available during my care of the patient were reviewed by me and considered in my medical decision making (see chart for details).  58 y.o. male with a history of type 2 diabetes presenting with 1 week of intermittent hypoglycemic episodes, despite medication adjustments with his PMD.  Overall, the patient is hemodynamically stable and has not been hypoglycemic here.  The remainder of his laboratory studies are reassuring with normal electrolytes, normal white blood cell count and normal blood counts.  His troponin here is less than 0.03 and a screening EKG is pending.  Also get a UA to rule out infection.  I have asked the patient to continue his metformin and hold his glipizide and Jardiance over the weekend.  I have asked him to monitor his blood sugars 3 times daily prior to meals and bring the record with him to his primary care physician's office on Monday  for reevaluation of his medication regimen.  I have discussed follow-up instructions as well as return precautions with the patient and his wife.  ----------------------------------------- 8:21 PM on 03/26/2018 -----------------------------------------  Not find any causes other than medications that would cause recurrent hypoglycemia in this patient.  At the time that he has discharge, the patient's urinalysis is pending and he is refusing to wait for the results.  I have encouraged him to have his primary care physician follow-up the results of the test, and he understands that if he has an infection this could be the reason for his low blood sugars.  ____________________________________________  FINAL CLINICAL IMPRESSION(S) / ED DIAGNOSES  Final diagnoses:  Hypoglycemia         NEW MEDICATIONS STARTED DURING THIS VISIT:  New  Prescriptions   No medications on file      Eula Listen, MD 03/26/18 2022

## 2018-07-13 NOTE — Telephone Encounter (Signed)
Message sent to provider 

## 2019-09-21 IMAGING — DX DG KNEE COMPLETE 4+V*R*
4 series · 4 of 4 positions shown · non-contrast
Comparison: None.

CLINICAL DATA: Pain.  History of gout

EXAM:
RIGHT KNEE - COMPLETE 4+ VIEW

[knee ap]
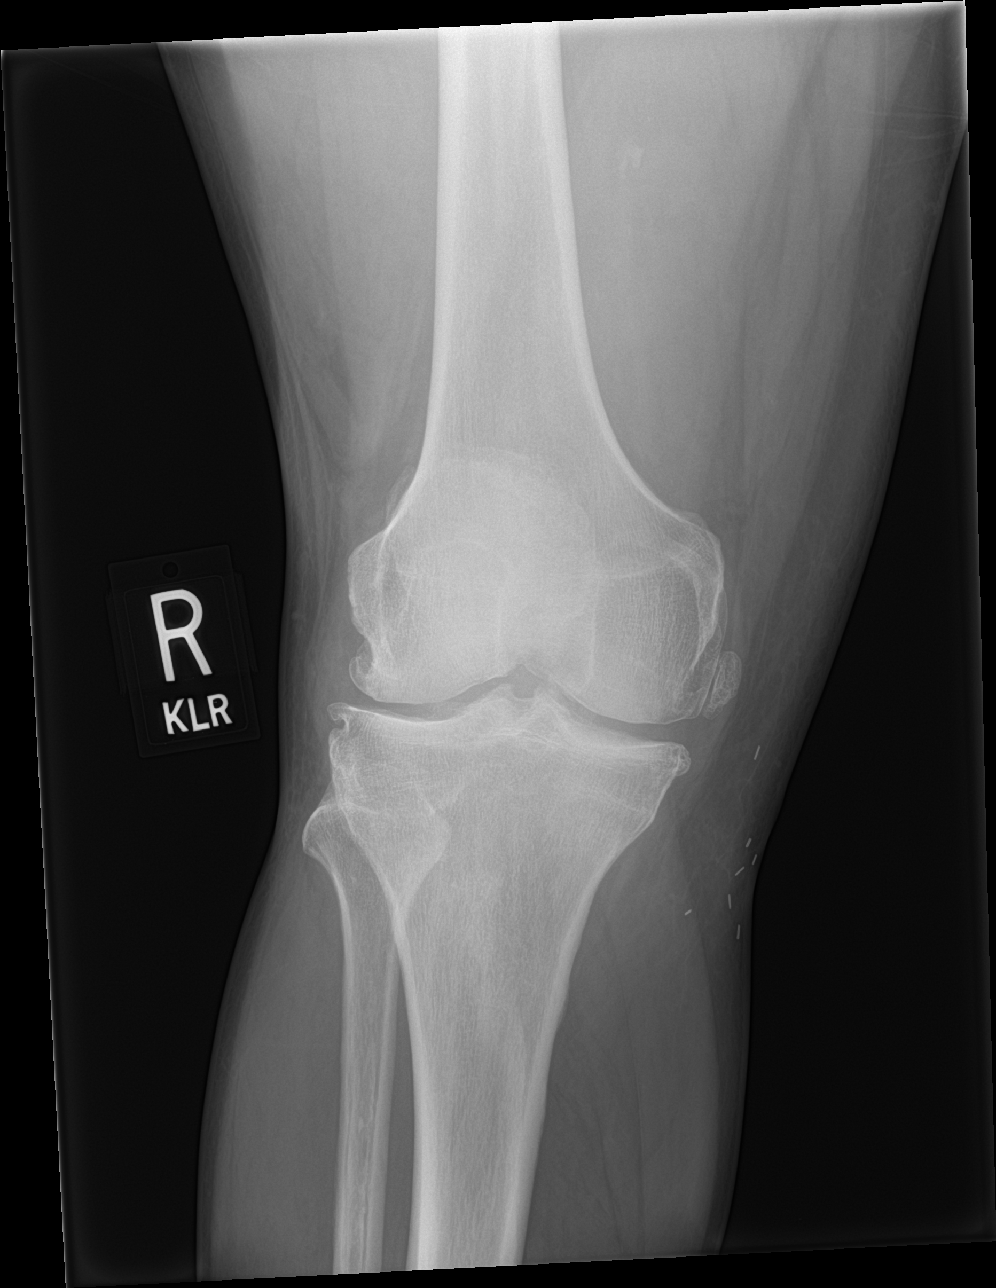

[knee lat]
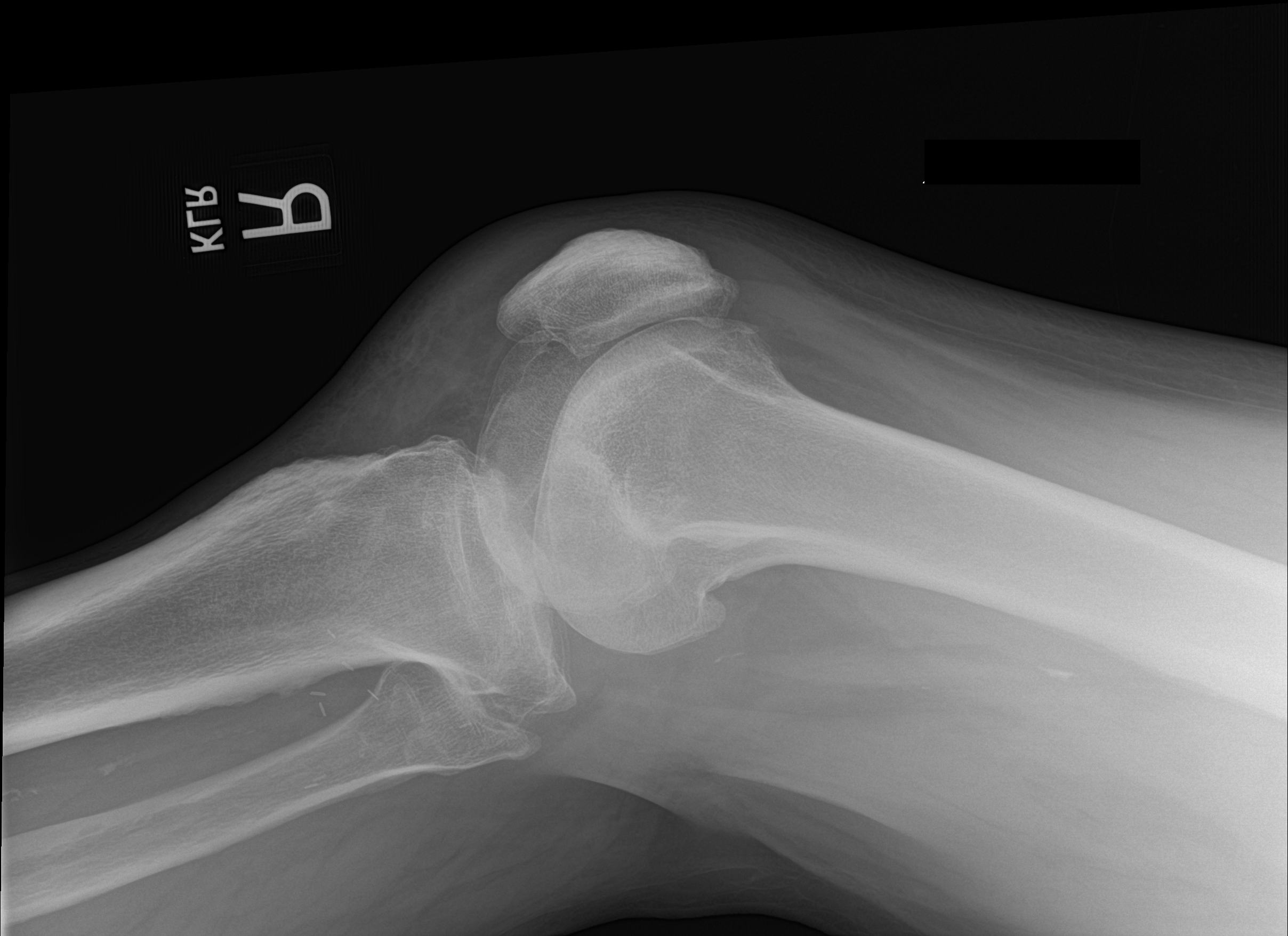

[knee obl (1 of 2)]
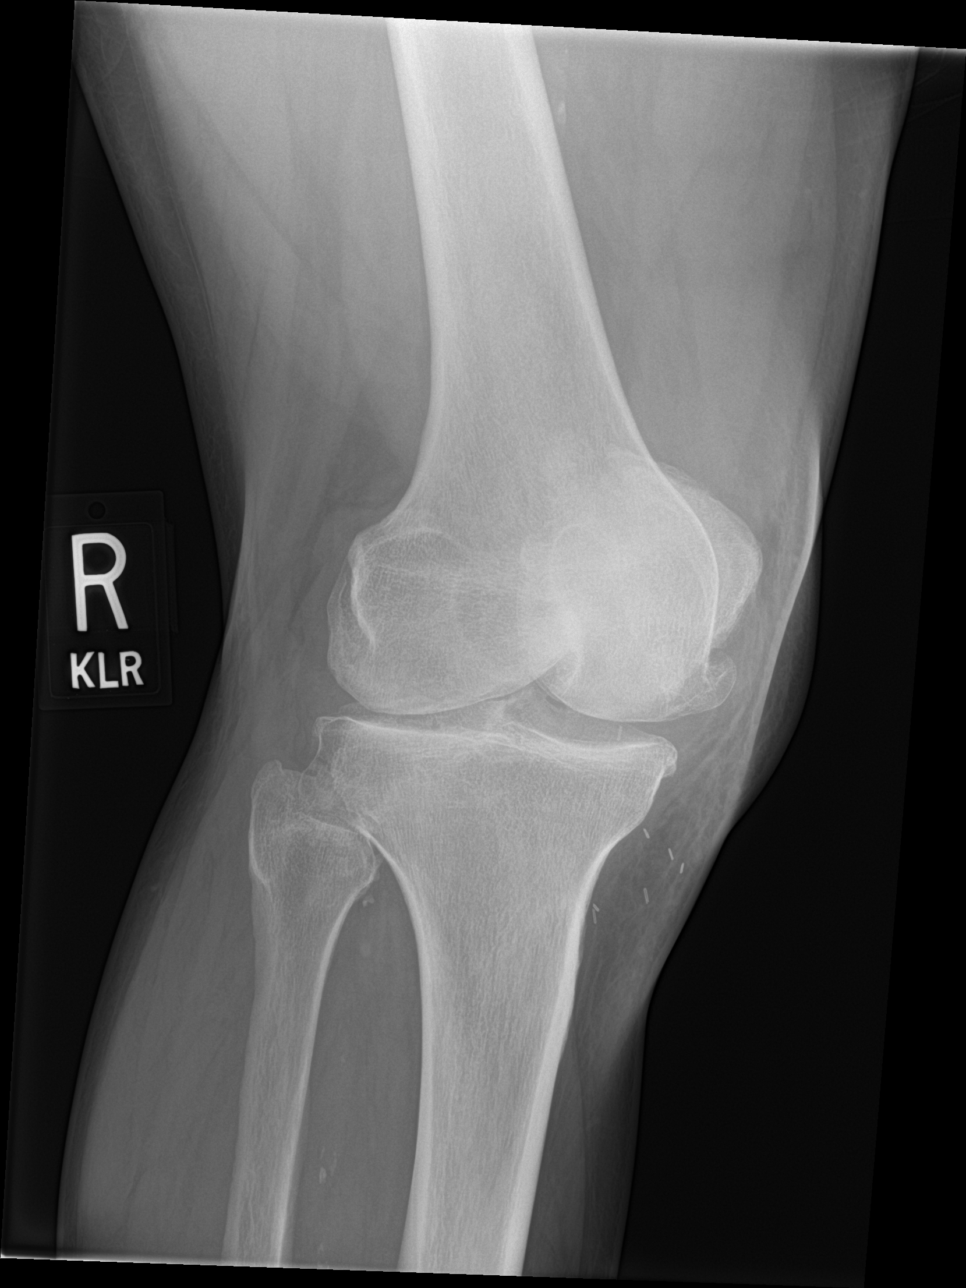

[knee obl (2 of 2)]
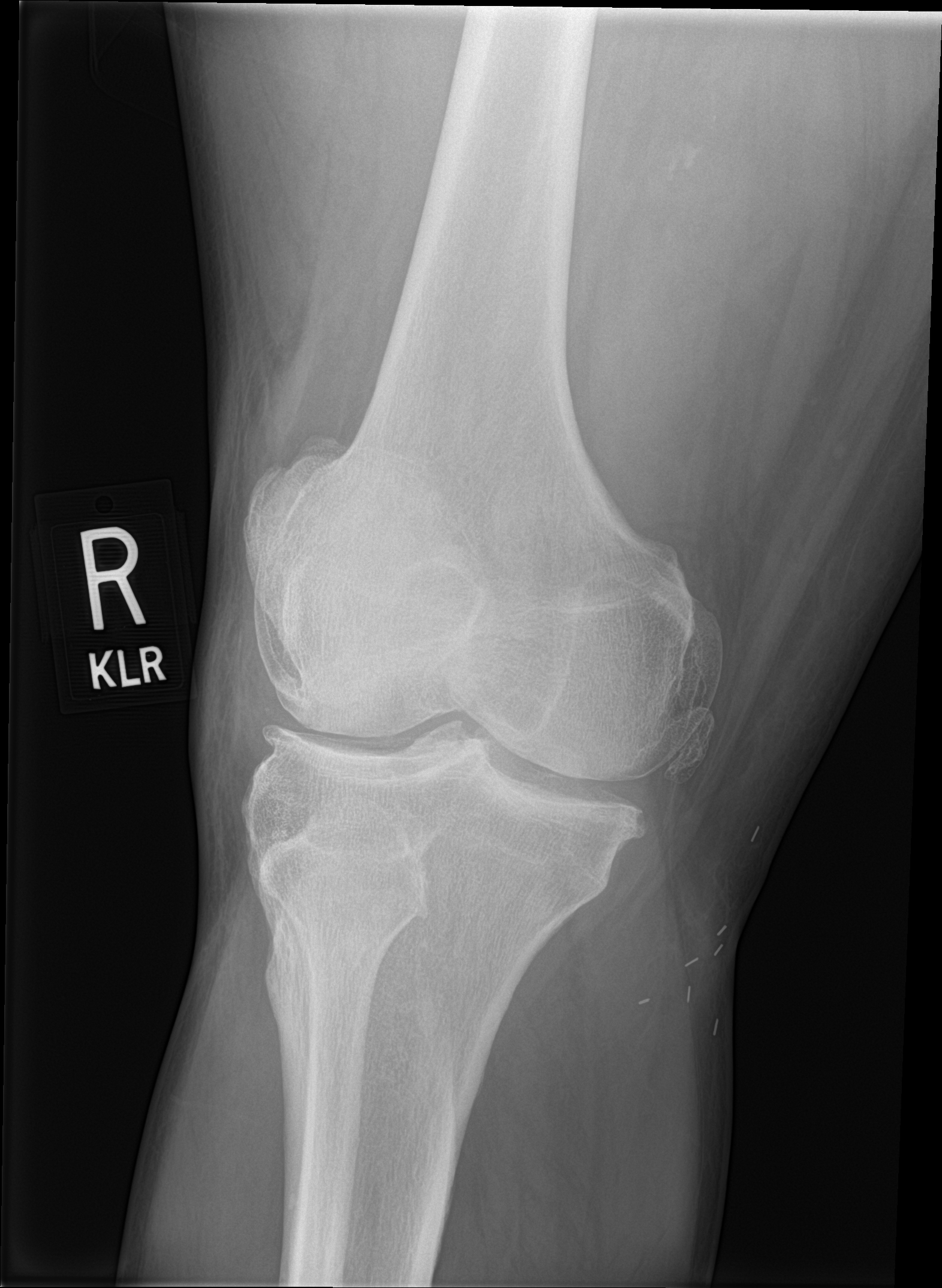

[4 of 4 positions shown; findings below may reference images not displayed]

FINDINGS: Frontal, lateral, and bilateral oblique views were obtained. There
is no evident fracture or dislocation. No joint effusion. There is
moderate joint space narrowing laterally and in the patellofemoral
joint region. There is spurring in all compartments. There is no
erosive change or intra-articular calcification. Postoperative
changes are noted in the soft tissues medially.
IMPRESSION: Areas of osteoarthritic change with narrowing most marked laterally
and in the patellofemoral joint. No erosive changes or
intra-articular calcification. No fracture or joint effusion.
Postoperative changes in the soft tissues medially noted.

## 2019-12-16 DEATH — deceased
